# Patient Record
Sex: Male | Born: 1980
Health system: Southern US, Community
[De-identification: ages and names within clinical notes are randomized; demographics above are authoritative.]

## PROBLEM LIST (undated history)

## (undated) DIAGNOSIS — K4090 Unilateral inguinal hernia, without obstruction or gangrene, not specified as recurrent: Secondary | ICD-10-CM

## (undated) DIAGNOSIS — F1410 Cocaine abuse, uncomplicated: Secondary | ICD-10-CM

## (undated) DIAGNOSIS — R002 Palpitations: Secondary | ICD-10-CM

## (undated) DIAGNOSIS — K279 Peptic ulcer, site unspecified, unspecified as acute or chronic, without hemorrhage or perforation: Secondary | ICD-10-CM

## (undated) DIAGNOSIS — R079 Chest pain, unspecified: Secondary | ICD-10-CM

## (undated) HISTORY — PX: CYSTOSTOMY W/ BLADDER BIOPSY: SHX1431

## (undated) HISTORY — DX: Palpitations: R00.2

## (undated) HISTORY — DX: Unilateral inguinal hernia, without obstruction or gangrene, not specified as recurrent: K40.90

## (undated) HISTORY — DX: Cocaine abuse, uncomplicated: F14.10

## (undated) HISTORY — DX: Chest pain, unspecified: R07.9

## (undated) HISTORY — DX: Peptic ulcer, site unspecified, unspecified as acute or chronic, without hemorrhage or perforation: K27.9

---
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
## 2005-11-02 ENCOUNTER — Encounter (INDEPENDENT_AMBULATORY_CARE_PROVIDER_SITE_OTHER): Payer: Self-pay | Admitting: *Deleted

## 2005-11-02 ENCOUNTER — Ambulatory Visit (HOSPITAL_BASED_OUTPATIENT_CLINIC_OR_DEPARTMENT_OTHER): Admission: RE | Admit: 2005-11-02 | Discharge: 2005-11-02 | Payer: Self-pay | Admitting: Urology

## 2007-07-26 ENCOUNTER — Emergency Department (HOSPITAL_COMMUNITY): Admission: EM | Admit: 2007-07-26 | Discharge: 2007-07-26 | Payer: Self-pay | Admitting: Emergency Medicine

## 2009-07-28 ENCOUNTER — Emergency Department (HOSPITAL_COMMUNITY): Admission: EM | Admit: 2009-07-28 | Discharge: 2009-07-28 | Payer: Self-pay | Admitting: Emergency Medicine

## 2009-07-30 ENCOUNTER — Emergency Department (HOSPITAL_COMMUNITY): Admission: EM | Admit: 2009-07-30 | Discharge: 2009-07-30 | Payer: Self-pay | Admitting: Emergency Medicine

## 2009-08-07 ENCOUNTER — Ambulatory Visit: Payer: Self-pay | Admitting: Cardiology

## 2009-08-07 ENCOUNTER — Inpatient Hospital Stay (HOSPITAL_COMMUNITY): Admission: EM | Admit: 2009-08-07 | Discharge: 2009-08-08 | Payer: Self-pay | Admitting: Emergency Medicine

## 2009-08-07 ENCOUNTER — Ambulatory Visit: Payer: Self-pay | Admitting: Internal Medicine

## 2009-08-07 ENCOUNTER — Encounter (INDEPENDENT_AMBULATORY_CARE_PROVIDER_SITE_OTHER): Payer: Self-pay | Admitting: Family Medicine

## 2009-08-08 ENCOUNTER — Encounter (INDEPENDENT_AMBULATORY_CARE_PROVIDER_SITE_OTHER): Payer: Self-pay | Admitting: *Deleted

## 2010-03-11 NOTE — Discharge Summary (Signed)
Summary: Hosp Discharge Summary    NAME:  Colin Davidson, Colin Davidson NO.:  [id number]      MEDICAL RECORD NO.:  [id number]          PATIENT TYPE:  INP      LOCATION:  1436                         FACILITY:  [facility]      PHYSICIAN:  Brendia Sacks, MD    DATE OF BIRTH:  11/25/1980      DATE OF ADMISSION:  08/07/2009   DATE OF DISCHARGE:  08/08/2009                                  DISCHARGE SUMMARY      PRIMARY CARE PHYSICIAN:  Will be [facility].      PRIMARY GASTROENTEROLOGIST:  Dr. Juanda Chance.      CONDITION ON DISCHARGE:  Improved.      DISCHARGE DIAGNOSES:   1. Upper gastrointestinal bleed secondary to NSAID use.   2. NSAID use.   3. Cocaine abuse.      HPI:  This is a 30 year old man who presented to the emergency room with   abdominal pain and dark stools.      HOSPITAL COURSE:   1. Upper GI bleed.  Patient was admitted to the medical floor.  Serial       hemoglobins showed no significant change in his hemoglobin.  His       epigastric pain resolved and he has had no further significant       bleeding.  He was seen in consultation by Dr. Juanda Chance of       Gastroenterology.  No EGD was recommended at this point.  Dr.       Juanda Chance has recommend Prilosec 20 mg daily for the next 6 weeks and       plans to see him in the outpatient setting.  Patient feels well and       wants to go to work today.   2. Cocaine abuse, have recommend cessation.   3. NSAID abuse.  He has been advised against using any NSAIDs.      CONSULTATIONS:  Dr. Juanda Chance, Gastroenterology.      PROCEDURES:  None.      IMAGING:  Acute abdominal series, August 07, 2009:  Large stool burden, no   acute findings.      ANCILLARY STUDIES:  A 2D echocardiogram:  Left ventricular ejection   fraction 55%, wall motion normal, no regional wall motion abnormalities.   No significant abnormalities noted.      PERTINENT LABORATORY STUDIES:   1. Urine drug screen positive for cocaine.   2. Serum lipase within  normal limits.   3. Serum alcohol level negative.   4. TSH within normal limits, 1.831.   5. CBC:  Hemoglobin 15.9 on admission, on discharge 12.5 and stable.       White blood cell count and platelet counts within normal limits.   6. Cardiac enzymes negative.      EXAM ON DISCHARGE:  Patient is feeling well.  No complaints.  No pain.   Temperature is 97.5.  Pulse 60.  Respirations 16.  Saturation 100% on   room air.  He appears well and stable for discharge.  DISCHARGE INSTRUCTIONS:  Patient should follow up with [facility], Dr.   Audria Nine, August 27, 2009, at 10:15 a.m. and with Willette Cluster, nurse   practitioner, at [facility] August 23, 2009, at 9:30 a.m.      ACTIVITY:  No restrictions.      DIET:  No restrictions.      SPECIAL INSTRUCTIONS:  No NSAIDs and no drug use.      DISCHARGE MEDICATIONS:   1. Prilosec over the counter 20 mg p.o. daily, take for 6 weeks.   2. Continue Adderall as directed.      Discontinue the following medication:   1. Meloxicam.   2. Any other NSAIDs.      OUTSTANDING STUDIES AT TIME OF DISCHARGE:  None.      THINGS TO FOLLOW UP IN THE OUTPATIENT SETTING:  Upper GI bleed, see   above.      TIME COORDINATING DISCHARGE:  20 minutes.               Brendia Sacks, MD            DG/MEDQ  D:  08/08/2009  T:  08/08/2009  Job:  [id number]      Electronically Signed by Brendia Sacks  on 08/22/2009 12:48:03 PM

## 2010-03-11 NOTE — Letter (Signed)
Summary: New Patient letter  [facility]  [address] [place], [place] [address]   Phone: [phone]  Fax: [phone]       08/08/2009 MRN: [id number]  Colin Davidson [address] [place], [place]  [address]  Dear Mr. ANCTIL,  Welcome to the Gastroenterology Division at [facility].    You are scheduled to see Willette Cluster on 08-23-09 at 9:30am on the 3rd floor at [facility], [address].  We ask that you try to arrive at our office 15 minutes prior to your appointment time to allow for check-in.  We would like you to complete the enclosed self-administered evaluation form prior to your visit and bring it with you on the day of your appointment.  We will review it with you.  Also, please bring a complete list of all your medications or, if you prefer, bring the medication bottles and we will list them.  Please bring your insurance card so that we may make a copy of it.  If your insurance requires a referral to see a specialist, please bring your referral form from your primary care physician.  Co-payments are due at the time of your visit and may be paid by cash, check or credit card.     Your office visit will consist of a consult with your physician (includes a physical exam), any laboratory testing he/she may order, scheduling of any necessary diagnostic testing (e.g. x-ray, ultrasound, CT-scan), and scheduling of a procedure (e.g. Endoscopy, Colonoscopy) if required.  Please allow enough time on your schedule to allow for any/all of these possibilities.    If you cannot keep your appointment, please call [phone] to cancel or reschedule prior to your appointment date.  This allows [place] the opportunity to schedule an appointment for another patient in need of care.  If you do not cancel or reschedule by 5 p.m. the business day prior to your appointment date, you will be charged a $50.00 late cancellation/no-show fee.    Thank you for choosing  [facility] for your medical needs.  We appreciate the opportunity to care for you.  Please visit [place] at our website  to learn more about our practice.                     Sincerely,                                                             The Gastroenterology Division

## 2010-04-16 ENCOUNTER — Emergency Department (HOSPITAL_COMMUNITY): Payer: Self-pay

## 2010-04-16 ENCOUNTER — Emergency Department (HOSPITAL_COMMUNITY)
Admission: EM | Admit: 2010-04-16 | Discharge: 2010-04-17 | Disposition: A | Discharge status: Home / Self Care | Payer: Self-pay | Attending: Emergency Medicine | Admitting: Emergency Medicine

## 2010-04-16 DIAGNOSIS — Z8711 Personal history of peptic ulcer disease: Secondary | ICD-10-CM | POA: Insufficient documentation

## 2010-04-16 DIAGNOSIS — F17200 Nicotine dependence, unspecified, uncomplicated: Secondary | ICD-10-CM | POA: Insufficient documentation

## 2010-04-16 DIAGNOSIS — R079 Chest pain, unspecified: Secondary | ICD-10-CM | POA: Insufficient documentation

## 2010-04-16 LAB — RAPID URINE DRUG SCREEN, HOSP PERFORMED
Amphetamines: NOT DETECTED
Barbiturates: NOT DETECTED
Benzodiazepines: NOT DETECTED
Cocaine: NOT DETECTED
Opiates: NOT DETECTED
Tetrahydrocannabinol: NOT DETECTED

## 2010-04-27 LAB — TSH: TSH: 1.831 u[IU]/mL (ref 0.350–4.500)

## 2010-04-27 LAB — COMPREHENSIVE METABOLIC PANEL
ALT: 13 U/L (ref 0–53)
AST: 21 U/L (ref 0–37)
Albumin: 4.4 g/dL (ref 3.5–5.2)
Alkaline Phosphatase: 78 U/L (ref 39–117)
Chloride: 102 mEq/L (ref 96–112)
GFR calc Af Amer: >60 mL/min (ref >60)
Potassium: 4.7 mEq/L (ref 3.5–5.1)
Sodium: 139 mEq/L (ref 135–145)
Total Bilirubin: 0.9 mg/dL (ref 0.3–1.2)
Total Protein: 7.5 g/dL (ref 6.0–8.3)

## 2010-04-27 LAB — CBC
HCT: 37.1 % — ABNORMAL LOW (ref 39.0–52.0)
HCT: 39.2 % (ref 39.0–52.0)
HCT: 41.1 % (ref 39.0–52.0)
Hemoglobin: 12.7 g/dL — ABNORMAL LOW (ref 13.0–17.0)
Hemoglobin: 13.4 g/dL (ref 13.0–17.0)
Hemoglobin: 14 g/dL (ref 13.0–17.0)
Hemoglobin: 14 g/dL (ref 13.0–17.0)
Hemoglobin: 14.1 g/dL (ref 13.0–17.0)
MCH: 31.8 pg (ref 26.0–34.0)
MCH: 32.1 pg (ref 26.0–34.0)
MCH: 32.3 pg (ref 26.0–34.0)
MCHC: 33.4 g/dL (ref 30.0–36.0)
MCHC: 34.3 g/dL (ref 30.0–36.0)
MCHC: 34.3 g/dL (ref 30.0–36.0)
MCV: 93.5 fL (ref 78.0–100.0)
MCV: 93.9 fL (ref 78.0–100.0)
MCV: 94.1 fL (ref 78.0–100.0)
MCV: 94.2 fL (ref 78.0–100.0)
MCV: 94.4 fL (ref 78.0–100.0)
Platelets: 243 10*3/uL (ref 150–400)
Platelets: 266 10*3/uL (ref 150–400)
Platelets: 305 10*3/uL (ref 150–400)
RBC: 4.17 MIL/uL — ABNORMAL LOW (ref 4.22–5.81)
RBC: 4.34 MIL/uL (ref 4.22–5.81)
RBC: 4.39 MIL/uL (ref 4.22–5.81)
RBC: 4.45 MIL/uL (ref 4.22–5.81)
RBC: 4.89 MIL/uL (ref 4.22–5.81)
RDW: 13 % (ref 11.5–15.5)
RDW: 13.2 % (ref 11.5–15.5)
RDW: 13.3 % (ref 11.5–15.5)
WBC: 7.1 10*3/uL (ref 4.0–10.5)
WBC: 7.4 10*3/uL (ref 4.0–10.5)
WBC: 8.6 10*3/uL (ref 4.0–10.5)
WBC: 9.5 10*3/uL (ref 4.0–10.5)

## 2010-04-27 LAB — DIFFERENTIAL
Basophils Absolute: 0 10*3/uL (ref 0.0–0.1)
Basophils Relative: 1 % (ref 0–1)
Basophils Relative: 1 % (ref 0–1)
Eosinophils Relative: 2 % (ref 0–5)
Lymphs Abs: 2 10*3/uL (ref 0.7–4.0)
Monocytes Absolute: 0.5 10*3/uL (ref 0.1–1.0)
Monocytes Absolute: 0.5 10*3/uL (ref 0.1–1.0)
Monocytes Relative: 6 % (ref 3–12)
Monocytes Relative: 6 % (ref 3–12)
Neutro Abs: 5 10*3/uL (ref 1.7–7.7)
Neutro Abs: 6 10*3/uL (ref 1.7–7.7)

## 2010-04-27 LAB — CARDIAC PANEL(CRET KIN+CKTOT+MB+TROPI)
CK, MB: 0.6 ng/mL (ref 0.3–4.0)
CK, MB: 0.7 ng/mL (ref 0.3–4.0)
CK, MB: 0.7 ng/mL (ref 0.3–4.0)
Relative Index: INVALID (ref 0.0–2.5)
Total CK: 54 U/L (ref 7–232)
Total CK: 55 U/L (ref 7–232)
Total CK: 56 U/L (ref 7–232)
Troponin I: 0.02 ng/mL (ref 0.00–0.06)

## 2010-04-27 LAB — GLUCOSE, CAPILLARY
Glucose-Capillary: 162 mg/dL — ABNORMAL HIGH (ref 70–99)
Glucose-Capillary: 83 mg/dL (ref 70–99)
Glucose-Capillary: 95 mg/dL (ref 70–99)

## 2010-04-27 LAB — HEMOCCULT GUIAC POC 1CARD (OFFICE): Fecal Occult Bld: POSITIVE

## 2010-04-27 LAB — ABO/RH: ABO/RH(D): A POS

## 2010-04-27 LAB — POCT I-STAT, CHEM 8
Calcium, Ion: 1.2 mmol/L (ref 1.12–1.32)
Chloride: 106 mEq/L (ref 96–112)
Creatinine, Ser: 0.9 mg/dL (ref 0.4–1.5)
Glucose, Bld: 99 mg/dL (ref 70–99)
HCT: 44 % (ref 39.0–52.0)

## 2010-04-27 LAB — ETHANOL: Alcohol, Ethyl (B): <5 mg/dL (ref 0–10)

## 2010-04-27 LAB — POCT CARDIAC MARKERS
CKMB, poc: 1 ng/mL (ref 1.0–8.0)
Troponin i, poc: <0.05 ng/mL (ref 0.00–0.09)

## 2010-04-27 LAB — RAPID URINE DRUG SCREEN, HOSP PERFORMED
Amphetamines: NOT DETECTED
Barbiturates: NOT DETECTED

## 2010-04-27 LAB — CROSSMATCH: ABO/RH(D): A POS

## 2010-06-27 NOTE — Op Note (Signed)
NAME:  Colin Davidson, Colin Davidson NO.:  [id number]   MEDICAL RECORD NO.:  [id number]          PATIENT TYPE:  AMB   LOCATION:  [facility]                         FACILITY:  [facility]   PHYSICIAN:  Sigmund I. Patsi Sears, M.D.DATE OF BIRTH:  20-Oct-1980   DATE OF PROCEDURE:  11/02/2005  DATE OF DISCHARGE:                                 OPERATIVE REPORT   PREOPERATIVE DIAGNOSES:  1. Chronic left testicular pain with left varicocele.  2. Gross hematuria.   OPERATIONS:  1. Left internal splenic vein ligation.  2. Flexible cystoscopy.   SURGEON:  Sigmund I. Patsi Sears, M.D.   ANESTHESIA:  General LMA.   PREPARATION:  After appropriate preanesthesia, the patient was brought to  the operating room, placed on the operating room in the dorsal supine  position, where general LMA anesthesia was induced.  He was then replaced in  the dorsal lithotomy position, where the pubis was prepped with Betadine  solution and draped in the usual fashion.   Review of history shows that this 30 year old single male has significant  chronic left testicular pain, particularly after exercising and sexual  activity.  The ultrasound examination showed a normal testicle but a left  varicocele was present.  The patient also has a history of hematuria and has  never had cystoscopy.  He is now for left internal splenic vein ligation and  cystoscopy.   PROCEDURE:  The left inguinal canal was prepped and draped in the usual  fashion.  It is noted that the patient previously shaved his left groin for  unknown cause, though.   A 6 cm left inguinal incision was made, subcutaneous tissue dissected with  the electrosurgical unit.  The Scarpa fascia layer was identified and  incised.  Multiple enlarged veins were identified, and these were ligated  with 3-0 Vicryl suture.   The fascia of the external oblique was identified and entered and  subcutaneous tissue was dissected in order to avoid injury to the  ilioinguinal nerve.  The pubic tubercle identified, and a right-angle clamp  was placed underneath the spermatic cord.   The spermatic cord was then dissected and a single large vein and several  smaller veins were identified and separately ligated with 3-0 Vicryl suture.  The vas and the artery to the testicle and artery of the vas were palpable  and remained in position at the posterior portion of the spermatic cord.  The spermatic cord was anesthetized with 0.25% Marcaine proximally and  distally.  No bleeding was noted.  There was a diastasis of the fascia, and  this was closed with running 3-0 well Vicryl suture.  The Scarpa fascia was  closed with running 3-0 Vicryl suture as well.  The skin was closed with  running 4-0 Vicryl subcuticular layer as well as Steri-Strips and collodion.  The patient was awakened and taken to the recovery room after given IV  Toradol, in good condition.  Cystoscopy was accomplished with a flexible  cystoscope and showed the patient had a normal urethra, normal prostate,  normal bladder neck, normal bladder base.  There was no evidence  of bladder  stone, tumor, or bladder diverticular formation.   The patient was then awakened and taken to the recovery room in good  condition.      Sigmund I. Patsi Sears, M.D.  Electronically Signed     SIT/MEDQ  D:  11/02/2005  T:  11/04/2005  Job:  [id number]

## 2010-11-06 LAB — COMPREHENSIVE METABOLIC PANEL
ALT: 13
AST: 18
Albumin: 4.4
Calcium: 9.9
GFR calc Af Amer: >60
Sodium: 135
Total Protein: 7.2

## 2010-11-06 LAB — CBC
MCHC: 34.6
RDW: 12.8

## 2010-11-06 LAB — URINALYSIS, ROUTINE W REFLEX MICROSCOPIC
Bilirubin Urine: NEGATIVE
Hgb urine dipstick: NEGATIVE
Specific Gravity, Urine: 1.007
Urobilinogen, UA: 0.2
pH: 7.5

## 2010-11-06 LAB — DIFFERENTIAL
Eosinophils Absolute: 0
Eosinophils Relative: 0
Lymphs Abs: 2
Monocytes Relative: 6

## 2010-11-06 LAB — RAPID URINE DRUG SCREEN, HOSP PERFORMED
Amphetamines: NOT DETECTED
Cocaine: NOT DETECTED
Opiates: NOT DETECTED
Tetrahydrocannabinol: NOT DETECTED

## 2013-12-17 ENCOUNTER — Encounter: Payer: Self-pay | Admitting: *Deleted

## 2016-03-27 ENCOUNTER — Ambulatory Visit
Admission: RE | Admit: 2016-03-27 | Discharge: 2016-03-27 | Disposition: A | Discharge status: Home / Self Care | Payer: Managed Care, Other (non HMO) | Source: Ambulatory Visit | Attending: Internal Medicine | Admitting: Internal Medicine

## 2016-03-27 ENCOUNTER — Other Ambulatory Visit: Payer: Self-pay | Admitting: Internal Medicine

## 2016-03-27 DIAGNOSIS — R070 Pain in throat: Secondary | ICD-10-CM

## 2016-11-24 DIAGNOSIS — G5603 Carpal tunnel syndrome, bilateral upper limbs: Secondary | ICD-10-CM | POA: Diagnosis not present

## 2017-03-26 DIAGNOSIS — F17210 Nicotine dependence, cigarettes, uncomplicated: Secondary | ICD-10-CM | POA: Diagnosis not present

## 2017-03-26 DIAGNOSIS — G5603 Carpal tunnel syndrome, bilateral upper limbs: Secondary | ICD-10-CM | POA: Diagnosis not present

## 2017-04-14 DIAGNOSIS — N50819 Testicular pain, unspecified: Secondary | ICD-10-CM | POA: Diagnosis not present

## 2017-04-19 DIAGNOSIS — N50819 Testicular pain, unspecified: Secondary | ICD-10-CM | POA: Diagnosis not present

## 2017-05-22 ENCOUNTER — Ambulatory Visit (HOSPITAL_COMMUNITY)
Admission: EM | Admit: 2017-05-22 | Discharge: 2017-05-22 | Disposition: A | Discharge status: Home / Self Care | Payer: 59 | Attending: Family Medicine | Admitting: Family Medicine

## 2017-05-22 ENCOUNTER — Other Ambulatory Visit: Payer: Self-pay

## 2017-05-22 DIAGNOSIS — Z202 Contact with and (suspected) exposure to infections with a predominantly sexual mode of transmission: Secondary | ICD-10-CM

## 2017-05-22 DIAGNOSIS — Z113 Encounter for screening for infections with a predominantly sexual mode of transmission: Secondary | ICD-10-CM

## 2017-05-22 DIAGNOSIS — R369 Urethral discharge, unspecified: Secondary | ICD-10-CM

## 2017-05-22 DIAGNOSIS — Z23 Encounter for immunization: Secondary | ICD-10-CM

## 2017-05-22 DIAGNOSIS — T1592XA Foreign body on external eye, part unspecified, left eye, initial encounter: Secondary | ICD-10-CM | POA: Diagnosis not present

## 2017-05-22 LAB — POCT URINALYSIS DIP (DEVICE)
BILIRUBIN URINE: NEGATIVE
Glucose, UA: NEGATIVE mg/dL
KETONES UR: NEGATIVE mg/dL
LEUKOCYTES UA: NEGATIVE
NITRITE: NEGATIVE
Protein, ur: NEGATIVE mg/dL
Specific Gravity, Urine: 1.015 (ref 1.005–1.030)
Urobilinogen, UA: 0.2 mg/dL (ref 0.0–1.0)
pH: 7 (ref 5.0–8.0)

## 2017-05-22 MED ORDER — AZITHROMYCIN 250 MG PO TABS
1000.0000 mg | ORAL_TABLET | Freq: Once | ORAL | Status: AC
  Administered 2017-05-22: 1000 mg via ORAL

## 2017-05-22 MED ORDER — CEFTRIAXONE SODIUM 250 MG IJ SOLR
250.0000 mg | Freq: Once | INTRAMUSCULAR | Status: AC
  Administered 2017-05-22: 250 mg via INTRAMUSCULAR

## 2017-05-22 MED ORDER — TETANUS-DIPHTH-ACELL PERTUSSIS 5-2.5-18.5 LF-MCG/0.5 IM SUSP
0.5000 mL | Freq: Once | INTRAMUSCULAR | Status: AC
  Administered 2017-05-22: 0.5 mL via INTRAMUSCULAR

## 2017-05-22 MED ORDER — TOBRAMYCIN 0.3 % OP SOLN: 1.0000 [drp] | Freq: Four times a day (QID) | OPHTHALMIC | 0 refills | Status: DC

## 2017-05-22 MED ORDER — HYDROCODONE-ACETAMINOPHEN 5-325 MG PO TABS: 1.0000 | ORAL_TABLET | Freq: Four times a day (QID) | ORAL | 0 refills | Status: DC | PRN

## 2017-05-22 MED ORDER — CEFTRIAXONE SODIUM 250 MG IJ SOLR
INTRAMUSCULAR | Status: AC
  Filled 2017-05-22: qty 250

## 2017-05-22 MED ORDER — TETRACAINE HCL 0.5 % OP SOLN
OPHTHALMIC | Status: AC
  Filled 2017-05-22: qty 4

## 2017-05-22 MED ORDER — AZITHROMYCIN 250 MG PO TABS
ORAL_TABLET | ORAL | Status: AC
  Filled 2017-05-22: qty 4

## 2017-05-22 MED ORDER — TETANUS-DIPHTH-ACELL PERTUSSIS 5-2.5-18.5 LF-MCG/0.5 IM SUSP
INTRAMUSCULAR | Status: AC
  Filled 2017-05-22: qty 0.5

## 2017-05-22 MED ORDER — LIDOCAINE HCL (PF) 1 % IJ SOLN
INTRAMUSCULAR | Status: AC
  Filled 2017-05-22: qty 2

## 2017-05-22 NOTE — ED Triage Notes (Signed)
Per pt he scratched his eye and now it burns when he trys to close it. Per pt its his left eye. Per pt she is also worried he may have either UTI or STD.

## 2017-05-22 NOTE — Discharge Instructions (Signed)

## 2017-05-22 NOTE — ED Provider Notes (Signed)
[facility]   [id number] 05/22/17 Arrival Time: 1743  ASSESSMENT & PLAN:  1. Foreign body, eye, left, initial encounter   2. Penile discharge     Meds ordered this encounter  Medications  . HYDROcodone-acetaminophen (NORCO/VICODIN) 5-325 MG tablet    Sig: Take 1 tablet by mouth every 6 (six) hours as needed for moderate pain or severe pain.    Dispense:  8 tablet    Refill:  0  . tobramycin (TOBREX) 0.3 % ophthalmic solution    Sig: Place 1 drop into the right eye every 6 (six) hours.    Dispense:  5 mL    Refill:  0  . azithromycin (ZITHROMAX) tablet 1,000 mg  . cefTRIAXone (ROCEPHIN) injection 250 mg  . Tdap (BOOSTRIX) injection 0.5 mL   Spoke with ophthalmologist on call. He is to call her office first thing Monday morning to be worked in. May return here if any new or worsening symptoms. Td given. Medication sedation precautions.  Empiric gonorrhea/chlamydia treatment given. Urine cytology sent.  Reviewed expectations re: course of current medical issues. Questions answered. Outlined signs and symptoms indicating need for more acute intervention. Patient verbalized understanding. After Visit Summary given.   SUBJECTIVE:  Colin Davidson is a 37 y.o. male who presents with complaint of possible foreign body in his left eye. Noticed discomfort after grinding metal today. No visual changes or photophobia. Able to keep eye open "but it feels gritty." No OTC treatment. Does not wear contact lenses. Unsure of last Td.  Also requests STD screening and treatment. Worried that he has been exposed. New male sexual partner. He noticed penile discharge yesterday. Continuing today. No urinary frequency or dysuria.   ROS: As per HPI.  OBJECTIVE:  General appearance: alert; no distress Eyes: PERRLA bilaterally, all EOMI, OS with watering; fluorescein shows punctate uptake over cornea; small foreign body visible Neck: supple Lungs: clear to auscultation  bilaterally Heart: regular rate and rhythm GU: declines Skin: warm and dry Psychological: alert and cooperative; normal mood and affect  Allergies  Allergen Reactions  . Chantix [Varenicline]     Past Medical History:  Diagnosis Date  . Chest pain syndrome   . Drug abuse, cocaine type   . Inguinal hernia   . Palpitations   . PUD (peptic ulcer disease)    Social History   Socioeconomic History  . Marital status: Single    Spouse name: Not on file  . Number of children: Not on file  . Years of education: Not on file  . Highest education level: Not on file  Occupational History  . Not on file  Social Needs  . Financial resource strain: Not on file  . Food insecurity:    Worry: Not on file    Inability: Not on file  . Transportation needs:    Medical: Not on file    Non-medical: Not on file  Tobacco Use  . Smoking status: Current Every Day Smoker  Substance and Sexual Activity  . Alcohol use: No  . Drug use: No  . Sexual activity: Not on file  Lifestyle  . Physical activity:    Days per week: Not on file    Minutes per session: Not on file  . Stress: Not on file  Relationships  . Social connections:    Talks on phone: Not on file    Gets together: Not on file    Attends religious service: Not on file    Active member of club  or organization: Not on file    Attends meetings of clubs or organizations: Not on file    Relationship status: Not on file  . Intimate partner violence:    Fear of current or ex partner: Not on file    Emotionally abused: Not on file    Physically abused: Not on file    Forced sexual activity: Not on file  Other Topics Concern  . Not on file  Social History Narrative  . Not on file   Family History  Family history unknown: Yes   No past surgical history on file.   Mardella Layman, MD 06/01/17 [phone]

## 2017-05-24 ENCOUNTER — Telehealth (HOSPITAL_COMMUNITY): Payer: Self-pay

## 2017-05-24 DIAGNOSIS — T1502XA Foreign body in cornea, left eye, initial encounter: Secondary | ICD-10-CM | POA: Diagnosis not present

## 2017-05-24 LAB — URINE CYTOLOGY ANCILLARY ONLY
CHLAMYDIA, DNA PROBE: NEGATIVE
Neisseria Gonorrhea: NEGATIVE
TRICH (WINDOWPATH): NEGATIVE

## 2017-05-24 NOTE — Telephone Encounter (Signed)
Results are within normal range. Pt contacted and made aware. Verbalized understanding.   

## 2017-07-28 DIAGNOSIS — M25562 Pain in left knee: Secondary | ICD-10-CM | POA: Diagnosis not present

## 2017-11-18 DIAGNOSIS — M7712 Lateral epicondylitis, left elbow: Secondary | ICD-10-CM | POA: Diagnosis not present

## 2017-11-18 DIAGNOSIS — H04301 Unspecified dacryocystitis of right lacrimal passage: Secondary | ICD-10-CM | POA: Diagnosis not present

## 2018-01-09 ENCOUNTER — Other Ambulatory Visit: Payer: Self-pay

## 2018-01-09 ENCOUNTER — Encounter (HOSPITAL_COMMUNITY): Payer: Self-pay | Admitting: Emergency Medicine

## 2018-01-09 ENCOUNTER — Ambulatory Visit (HOSPITAL_COMMUNITY)
Admission: EM | Admit: 2018-01-09 | Discharge: 2018-01-09 | Disposition: A | Discharge status: Home / Self Care | Payer: 59 | Attending: Family Medicine | Admitting: Family Medicine

## 2018-01-09 DIAGNOSIS — J40 Bronchitis, not specified as acute or chronic: Secondary | ICD-10-CM | POA: Diagnosis not present

## 2018-01-09 MED ORDER — METHYLPREDNISOLONE SODIUM SUCC 125 MG IJ SOLR
80.0000 mg | Freq: Once | INTRAMUSCULAR | Status: AC
  Administered 2018-01-09: 80 mg via INTRAMUSCULAR

## 2018-01-09 MED ORDER — ALBUTEROL SULFATE HFA 108 (90 BASE) MCG/ACT IN AERS: 2.0000 | INHALATION_SPRAY | RESPIRATORY_TRACT | 1 refills | Status: DC | PRN

## 2018-01-09 MED ORDER — METHYLPREDNISOLONE SODIUM SUCC 125 MG IJ SOLR
INTRAMUSCULAR | Status: AC
  Filled 2018-01-09: qty 2

## 2018-01-09 NOTE — ED Triage Notes (Signed)
[facility] a [facility] a [facility] x 1 day.

## 2018-01-09 NOTE — ED Provider Notes (Signed)
[facility]    CSN: [id number] Arrival date & time: 01/09/18  1409     History   Chief Complaint Chief Complaint  Patient presents with  . Cough    HPI Colin Davidson is a 37 y.o. male.   This is a 37 year old man who presents with 1 day of cough.  He is an established patient here.  His primary doctor is Dr. Johnella MoloneyNeville Gates.  Patient is a smoker.  He says that it hurts quite a bit cough.  He is bringing up green phlegm.  He did have some chills last night.     Past Medical History:  Diagnosis Date  . Chest pain syndrome   . Drug abuse, cocaine type (HCC)   . Inguinal hernia   . Palpitations   . PUD (peptic ulcer disease)     There are no active problems to display for this patient.   History reviewed. No pertinent surgical history.     Home Medications    Prior to Admission medications   Medication Sig Start Date End Date Taking? Authorizing Provider  albuterol (PROVENTIL HFA;VENTOLIN HFA) 108 (90 Base) MCG/ACT inhaler Inhale 2 puffs into the lungs every 4 (four) hours as needed for wheezing or shortness of breath (cough, shortness of breath or wheezing.). 01/09/18   Elvina SidleLauenstein, Jatoria Kneeland, MD    Family History Family History  Family history unknown: Yes    Social History Social History   Tobacco Use  . Smoking status: Current Every Day Smoker  Substance Use Topics  . Alcohol use: No  . Drug use: No     Allergies   Chantix [varenicline]   Review of Systems Review of Systems  Constitutional: Positive for chills.  HENT: Positive for sore throat.   Respiratory: Positive for cough.      Physical Exam Triage Vital Signs ED Triage Vitals  Enc Vitals Group     BP 01/09/18 1539 (!) 123/55     Pulse Rate 01/09/18 1539 (!) 104     Resp 01/09/18 1539 16     Temp 01/09/18 1539 98.5 F (36.9 C)     Temp Source 01/09/18 1539 Oral     SpO2 01/09/18 1539 96 %     Weight --      Height --      Head Circumference --      Peak Flow --    Pain Score 01/09/18 1538 8     Pain Loc --      Pain Edu? --      Excl. in GC? --    No data found.  Updated Vital Signs BP (!) 123/55 (BP Location: Right Arm)   Pulse (!) 104   Temp 98.5 F (36.9 C) (Oral)   Resp 16   SpO2 96%    Physical Exam  Constitutional: He is oriented to person, place, and time. He appears well-developed and well-nourished.  HENT:  Right Ear: External ear normal.  Left Ear: External ear normal.  Mouth/Throat: Oropharynx is clear and moist.  Missing a few teeth Erythematous posterior pharynx  Eyes: Conjunctivae are normal.  Neck: Normal range of motion. Neck supple.  Cardiovascular: Normal rate, regular rhythm and normal heart sounds.  Pulmonary/Chest: Effort normal. He has wheezes.  Coarse rhonchi in both lung fields  Musculoskeletal: Normal range of motion.  Neurological: He is alert and oriented to person, place, and time.  Skin: Skin is warm and dry.  Nursing note and vitals reviewed.  UC Treatments / Results  Labs (all labs ordered are listed, but only abnormal results are displayed) Labs Reviewed - No data to display  EKG None  Radiology No results found.  Procedures Procedures (including critical care time)  Medications Ordered in UC Medications  methylPREDNISolone sodium succinate (SOLU-MEDROL) 125 mg/2 mL injection 80 mg (has no administration in time range)    Initial Impression / Assessment and Plan / UC Course  I have reviewed the triage vital signs and the nursing notes.  Pertinent labs & imaging results that were available during my care of the patient were reviewed by me and considered in my medical decision making (see chart for details).    Final Clinical Impressions(s) / UC Diagnoses   Final diagnoses:  Bronchitis   Discharge Instructions   None    ED Prescriptions    Medication Sig Dispense Auth. Provider   albuterol (PROVENTIL HFA;VENTOLIN HFA) 108 (90 Base) MCG/ACT inhaler Inhale 2 puffs into the  lungs every 4 (four) hours as needed for wheezing or shortness of breath (cough, shortness of breath or wheezing.). 1 Inhaler Elvina Sidle, MD     Controlled Substance Prescriptions [place] Controlled Substance Registry consulted? Not Applicable   Elvina Sidle, MD 01/09/18 [phone]

## 2018-01-17 ENCOUNTER — Ambulatory Visit
Admission: RE | Admit: 2018-01-17 | Discharge: 2018-01-17 | Disposition: A | Discharge status: Home / Self Care | Payer: 59 | Source: Ambulatory Visit | Attending: Geriatric Medicine | Admitting: Geriatric Medicine

## 2018-01-17 ENCOUNTER — Other Ambulatory Visit: Payer: Self-pay | Admitting: Geriatric Medicine

## 2018-01-17 DIAGNOSIS — R059 Cough, unspecified: Secondary | ICD-10-CM

## 2018-01-17 DIAGNOSIS — R05 Cough: Secondary | ICD-10-CM

## 2018-01-17 DIAGNOSIS — R062 Wheezing: Secondary | ICD-10-CM | POA: Diagnosis not present

## 2018-06-02 MED FILL — AMPHETAMINE-DEXTROAMPHETAMI: 30 | 30 days supply | Qty: 60 | Fill #0

## 2018-06-30 MED FILL — AMPHETAMINE-DEXTROAMPHETAMI: 30 | 30 days supply | Qty: 60 | Fill #0

## 2018-08-01 MED FILL — AMPHETAMINE-DEXTROAMPHETAMI: 30 | 30 days supply | Qty: 60 | Fill #0

## 2018-08-31 MED FILL — AMPHETAMINE-DEXTROAMPHETAMI: 30 | 30 days supply | Qty: 60 | Fill #0

## 2018-09-06 ENCOUNTER — Other Ambulatory Visit: Payer: Self-pay

## 2018-09-06 ENCOUNTER — Ambulatory Visit (INDEPENDENT_AMBULATORY_CARE_PROVIDER_SITE_OTHER): Payer: 59

## 2018-09-06 ENCOUNTER — Ambulatory Visit (HOSPITAL_COMMUNITY)
Admission: EM | Admit: 2018-09-06 | Discharge: 2018-09-06 | Disposition: A | Discharge status: Home / Self Care | Payer: 59 | Attending: Family Medicine | Admitting: Family Medicine

## 2018-09-06 ENCOUNTER — Encounter (HOSPITAL_COMMUNITY): Payer: Self-pay | Admitting: Emergency Medicine

## 2018-09-06 DIAGNOSIS — S20212A Contusion of left front wall of thorax, initial encounter: Secondary | ICD-10-CM

## 2018-09-06 MED ORDER — HYDROCODONE-ACETAMINOPHEN 5-325 MG PO TABS: 1.0000 | ORAL_TABLET | Freq: Four times a day (QID) | ORAL | 0 refills | Status: DC | PRN

## 2018-09-06 MED FILL — HYDROCODON-APAP 5-325: 5-325 | 3 days supply | Qty: 10 | Fill #0

## 2018-09-06 NOTE — ED Provider Notes (Signed)
[facility]   [id number] 09/06/18 Arrival Time: 1027  ASSESSMENT & PLAN:  1. Rib contusion, left, initial encounter     I have personally viewed the imaging studies ordered this visit. No fractures identified. No pneumothorax.  Continue ibuprofen 800mg  TID with food in addition to prn: Meds ordered this encounter  Medications  . HYDROcodone-acetaminophen (NORCO/VICODIN) 5-325 MG tablet    Sig: Take 1 tablet by mouth every 6 (six) hours as needed for moderate pain or severe pain.    Dispense:  10 tablet    Refill:  0   Work note with light duty recommendation provided. Activities as tolerated.  [place] Controlled Substances Registry consulted for this patient. I feel the risk/benefit ratio today is favorable for proceeding with this prescription for a controlled substance. Medication sedation precautions given.  Reviewed expectations re: course of current medical issues. Questions answered. Outlined signs and symptoms indicating need for more acute intervention. Patient verbalized understanding. After Visit Summary given.  SUBJECTIVE: History from: patient. Colin Davidson is a 38 y.o. male who reports persistent moderate pain of his left ribs; described as aching without radiation. Onset: abrupt, a week ago. Injury/trama: reports fall from ladder while 1 foot above ground; landed on L ribs/side; immediate pain. Symptoms have progressed to a point and plateaued since beginning. "Just not getting better." No trouble breathing. Aggravating factors: movement and deep breaths. Alleviating factors: rest. Associated symptoms: none reported. Extremity sensation changes or weakness: none. Self treatment: tried OTCs without relief of pain. History of similar: no.  History reviewed. No pertinent surgical history.   ROS: As per HPI. All other systems negative.    OBJECTIVE:  Vitals:   09/06/18 1128  BP: 134/76  Pulse: 66  Resp: 18  Temp: 98.5 F (36.9 C)  SpO2: 98%     General appearance: alert; no distress HEENT: [place]; AT Neck: supple with FROM CV: RRR Resp: CTAB; unlabored respirations Chest wall: tender to palpation over L anterior and side; no bruising Extremities: moving all extremities normally Skin: warm and dry; no visible rashes Neurologic: gait normal; normal reflexes of RUE and LUE; normal sensation of RUE and LUE; normal strength of RUE and LUE Psychological: alert and cooperative; normal mood and affect  Imaging: Dg Ribs Unilateral W/chest Left  Result Date: 09/06/2018 CLINICAL DATA:  LEFT side rib pain, fell 6 days ago EXAM: LEFT RIBS AND CHEST - 3+ VIEW COMPARISON:  Chest radiographs 01/17/2018 FINDINGS: Normal heart size, mediastinal contours, and pulmonary vascularity. Lungs clear. No infiltrate, pleural effusion or pneumothorax. Osseous mineralization normal. BB placed at site of symptoms lower LEFT chest. No rib fracture or bone destruction. IMPRESSION: Normal exam. Electronically Signed   By: Ulyses SouthwardMark  Boles M.D.   On: 09/06/2018 12:16      Allergies  Allergen Reactions  . Chantix [Varenicline]     Past Medical History:  Diagnosis Date  . Chest pain syndrome   . Drug abuse, cocaine type (HCC)   . Inguinal hernia   . Palpitations   . PUD (peptic ulcer disease)    Social History   Socioeconomic History  . Marital status: Single    Spouse name: Not on file  . Number of children: Not on file  . Years of education: Not on file  . Highest education level: Not on file  Occupational History  . Not on file  Social Needs  . Financial resource strain: Not on file  . Food insecurity    Worry: Not on file  Inability: Not on file  . Transportation needs    Medical: Not on file    Non-medical: Not on file  Tobacco Use  . Smoking status: Current Every Day Smoker  Substance and Sexual Activity  . Alcohol use: No  . Drug use: No  . Sexual activity: Not on file  Lifestyle  . Physical activity    Days per week: Not on file     Minutes per session: Not on file  . Stress: Not on file  Relationships  . Social Herbalist on phone: Not on file    Gets together: Not on file    Attends religious service: Not on file    Active member of club or organization: Not on file    Attends meetings of clubs or organizations: Not on file    Relationship status: Not on file  Other Topics Concern  . Not on file  Social History Narrative  . Not on file   Family History  Family history unknown: Yes   History reviewed. No pertinent surgical history.    Vanessa Kick, MD 09/06/18 1452

## 2018-09-06 NOTE — Discharge Instructions (Addendum)
Be aware, pain medications may cause drowsiness. Please do not drive, operate heavy machinery or make important decisions while on this medication, it can cloud your judgement.  

## 2018-09-06 NOTE — ED Triage Notes (Signed)
Pt states he fell off a ladder, only a foot or so, but landed on his L rib area, states it happened a week ago but its still causing him a lot of discomfort.

## 2018-09-30 MED FILL — AMPHETAMINE-DEXTROAMPHETAMI: 30 | 30 days supply | Qty: 60 | Fill #0

## 2018-11-01 MED FILL — AMPHETAMINE-DEXTROAMPHETAMI: 30 | 30 days supply | Qty: 60 | Fill #0

## 2018-12-01 MED FILL — AMPHETAMINE-DEXTROAMPHETAMI: 30 | 30 days supply | Qty: 60 | Fill #0

## 2019-01-02 MED FILL — AMPHETAMINE-DEXTROAMPHETAMI: 30 | 30 days supply | Qty: 60 | Fill #0

## 2019-01-30 MED FILL — AMPHETAMINE-DEXTROAMPHETAMI: 30 | 30 days supply | Qty: 60 | Fill #0

## 2019-02-27 MED FILL — AMPHETAMINE-DEXTROAMPHETAMI: 30 | 30 days supply | Qty: 60 | Fill #0

## 2019-02-27 MED FILL — SUMATRIPTAN SUCC 100 MG TAB: 100 | 30 days supply | Qty: 10 | Fill #0

## 2019-04-01 MED FILL — AMPHETAMINE-DEXTROAMPHETAMI: 30 | 30 days supply | Qty: 60 | Fill #0

## 2019-04-01 MED FILL — SUMATRIPTAN SUCC 100 MG TAB: 100 | 30 days supply | Qty: 10 | Fill #1

## 2019-04-27 ENCOUNTER — Telehealth: Payer: Self-pay | Admitting: Psychiatry

## 2019-04-27 NOTE — Telephone Encounter (Signed)
Error

## 2019-05-01 MED FILL — SUMATRIPTAN SUCC 100 MG TAB: 100 | 30 days supply | Qty: 10 | Fill #2

## 2019-05-01 MED FILL — AMPHETAMINE-DEXTROAMPHETAMI: 30 | 30 days supply | Qty: 60 | Fill #0

## 2019-05-03 ENCOUNTER — Ambulatory Visit: Payer: Self-pay | Admitting: Psychiatry

## 2019-05-04 ENCOUNTER — Ambulatory Visit (INDEPENDENT_AMBULATORY_CARE_PROVIDER_SITE_OTHER): Payer: 59 | Admitting: Psychiatry

## 2019-05-04 ENCOUNTER — Other Ambulatory Visit: Payer: Self-pay

## 2019-05-04 ENCOUNTER — Encounter: Payer: Self-pay | Admitting: Psychiatry

## 2019-05-04 DIAGNOSIS — F411 Generalized anxiety disorder: Secondary | ICD-10-CM
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
## 2019-06-01 MED FILL — AMPHETAMINE-DEXTROAMPHETAMI: 30 | 30 days supply | Qty: 60 | Fill #0

## 2019-06-01 MED FILL — SUMATRIPTAN SUCC 100 MG TAB: 100 | 30 days supply | Qty: 10 | Fill #3

## 2019-06-08 ENCOUNTER — Ambulatory Visit: Payer: 59 | Admitting: Psychiatry

## 2019-06-16 ENCOUNTER — Ambulatory Visit (INDEPENDENT_AMBULATORY_CARE_PROVIDER_SITE_OTHER): Payer: 59 | Admitting: Psychiatry

## 2019-06-16 ENCOUNTER — Other Ambulatory Visit: Payer: Self-pay

## 2019-06-16 ENCOUNTER — Encounter: Payer: Self-pay | Admitting: Psychiatry

## 2019-06-16 DIAGNOSIS — F411 Generalized anxiety disorder: Secondary | ICD-10-CM

## 2019-06-16 NOTE — Progress Notes (Signed)
      [facility] Counselor/Therapist Progress Note  Patient ID: Colin Davidson, MRN: [id number],    Date: 06/16/2019  Time Spent: 50 minutes   Treatment Type: Individual Therapy  Reported Symptoms: anxiety  Mental Status Exam:  Appearance:   Casual     Behavior:  Appropriate  Motor:  Normal  Speech/Language:   Clear and Coherent  Affect:  Appropriate  Mood:  anxious  Thought process:  normal  Thought content:    WNL  Sensory/Perceptual disturbances:    WNL  Orientation:  oriented to person, place, time/date and situation  Attention:  Good  Concentration:  Good  Memory:  WNL  Fund of knowledge:   Good  Insight:    Good  Judgment:   Good  Impulse Control:  Good   Risk Assessment: Danger to Self:  No Self-injurious Behavior: No Danger to Others: No Duty to Warn:no Physical Aggression / Violence:No  Access to Firearms a concern: No  Gang Involvement:No   Subjective: "I felt so much relief after last session."  The client has been able to manage his relationship with his ex-wife more effectively.  He has decided that he can no longer be in her life.  He has started another relationship he is very invested in.  He plans on telling his ex in-laws this weekend.  He is worried about his ex-wife attempting to take her life again.  I used eye-movement with the client focusing on his fear.  His subjective units of distress was 4.  As the client processed the main fear that came up was that she would hurt herself.  He also feels like there will be a personal consequence for him because they have been connected for so long.  In addition he realized that he likes his new relationship and wants to pursue that.  As long as he is in relationship with his ex-wife it continues to hurt him.  The client's subjective units of distress was less than 1 when we finished.  His positive cognition was, "I can say goodbye to her." The client has also put an offer on a house in [place], [place].  He  hopes to hear today if the offer was accepted.  He also plans to ask his new girlfriend to move in with him.  I cautioned the client that he might be moving a little bit too quickly.  He understands but is going to pursue this anyway.  Interventions: Assertiveness/Communication, Motivational Interviewing, Solution-Oriented/Positive Psychology, [name] and Insight-Oriented  Diagnosis:   ICD-10-CM   1. Generalized anxiety disorder  F41.1     Plan: Boundaries, assertiveness, self-care, positive self talk, radical acceptance.  Gelene Mink Teesha Ohm, [facility]

## 2019-06-23 ENCOUNTER — Ambulatory Visit (INDEPENDENT_AMBULATORY_CARE_PROVIDER_SITE_OTHER): Payer: 59 | Admitting: Psychiatry

## 2019-06-23 ENCOUNTER — Other Ambulatory Visit: Payer: Self-pay

## 2019-06-23 ENCOUNTER — Encounter: Payer: Self-pay | Admitting: Psychiatry

## 2019-06-23 DIAGNOSIS — F411 Generalized anxiety disorder: Secondary | ICD-10-CM

## 2019-06-23 NOTE — Progress Notes (Signed)
      [facility] Counselor/Therapist Progress Note  Patient ID: Colin Davidson, MRN: [id number],    Date: 06/23/2019  Time Spent: 50 minutes   Treatment Type: Individual Therapy  Reported Symptoms: anxious  Mental Status Exam:  Appearance:   Casual     Behavior:  Appropriate  Motor:  Normal  Speech/Language:   Clear and Coherent  Affect:  Appropriate  Mood:  anxious  Thought process:  normal  Thought content:    WNL  Sensory/Perceptual disturbances:    WNL  Orientation:  oriented to person, place, time/date and situation  Attention:  Good  Concentration:  Good  Memory:  WNL  Fund of knowledge:   Good  Insight:    Good  Judgment:   Good  Impulse Control:  Good   Risk Assessment: Danger to Self:  No Self-injurious Behavior: No Danger to Others: No Duty to Warn:no Physical Aggression / Violence:No  Access to Firearms a concern: No  Gang Involvement:No   Subjective: The client stated that today he wanted to work on his boundaries with his ex-wife.  She had recently attempted suicide which the client experienced as very traumatic.  "What are going to be my boundaries?"  I used eye-movement with the client focusing on his ex-wife.  His emotions were guilt and responsibility.  His subjective units of distress was a 5.  As the client processed he came up with 3 clear boundaries.  "We will have no sex.  I will not caretake.  I cannot be her husband."  The client is wondering if he can just be his ex-wife's friend?  I explained to the client with his history that probably would not work out well.  He agreed.  I pointed out that with his sexually addictive behavior if she offered him sex he would fall into it.  He agreed.  As we continued to process the client realized how his sex addiction correlated to his alcohol use.  "I use it to change the way I feel."  As we continued to process this the client saw that he needed to fill himself up and used sex to do it.  I asked the client what  he was trying to fill?  He was unsure.  We did discuss that this was probably a spiritual issue.  He agreed.  As the session came to a close, the client wanted to make sure that we processed through the trauma of his ex-wife's suicide attempt.  His subjective units of distress at the end of the session was less than 3.  Interventions: Assertiveness/Communication, Motivational Interviewing, Solution-Oriented/Positive Psychology, [name] and Insight-Oriented  Diagnosis:   ICD-10-CM   1. Generalized anxiety disorder  F41.1     Plan: Mood independent behavior, boundaries, assertiveness, self-care, sexual sobriety, read more about codependence.  Gelene Mink Sania Noy, [facility]

## 2019-07-31 ENCOUNTER — Encounter: Payer: Self-pay | Admitting: Psychiatry

## 2019-07-31 ENCOUNTER — Ambulatory Visit (INDEPENDENT_AMBULATORY_CARE_PROVIDER_SITE_OTHER): Payer: 59 | Admitting: Psychiatry

## 2019-07-31 ENCOUNTER — Other Ambulatory Visit: Payer: Self-pay

## 2019-07-31 DIAGNOSIS — F411 Generalized anxiety disorder: Secondary | ICD-10-CM

## 2019-07-31 NOTE — Progress Notes (Signed)
      [facility] Counselor/Therapist Progress Note  Patient ID: Colin Davidson, MRN: [id number],    Date: 07/31/2019  Time Spent: 50 minutes   Treatment Type: Individual Therapy  Reported Symptoms: anxiety  Mental Status Exam:  Appearance:   Casual     Behavior:  Appropriate  Motor:  Normal  Speech/Language:   Clear and Coherent  Affect:  Appropriate  Mood:  anxious  Thought process:  normal  Thought content:    WNL  Sensory/Perceptual disturbances:    WNL  Orientation:  oriented to person, place, time/date and situation  Attention:  Good  Concentration:  Good  Memory:  WNL  Fund of knowledge:   Good  Insight:    Good  Judgment:   Good  Impulse Control:  Good   Risk Assessment: Danger to Self:  No Self-injurious Behavior: No Danger to Others: No Duty to Warn:no Physical Aggression / Violence:No  Access to Firearms a concern: No  Gang Involvement:No   Subjective: The client reports that he and his girlfriend got married last weekend.  He states that she is [redacted] weeks pregnant.  They had recently got into a conflict and at my suggestion is reading the book codependent no more.  "It has my name all over it."  The client realized that when he gets anxious he can get angry and verbally abusive.  This happens when he is texting with his wife.  I went over the handout on common thinking errors with the client.  I explained that text messaging can be a very poor way to communicate clearly.  He realized that he tends to misinterpret things and then read more into it. Today I used eye-movement with the client focusing on his defensiveness.  As the client processed he stated he felt anxious about working on it.  He realizes that it makes him more vulnerable.  I discussed with the client how in the past he used his substance abuse as a way to protect himself and avoid feelings.  He agreed.  He has been sober for a number of years now and realizes also that he needs to address his sexual  addiction as well.  As the client continued to process he felt his anxiety go from his subjective units of distress of 5 to less than 1.  He will continue to review "codependent no more" and [name] attend Al-Anon meetings.  Interventions: Assertiveness/Communication, Motivational Interviewing, Solution-Oriented/Positive Psychology, [name] and Insight-Oriented  Diagnosis:   ICD-10-CM   1. Generalized anxiety disorder  F41.1     Plan: Al-Anon meetings, boundaries, assertiveness, positive self talk, self-care, mood independent behavior, review thinking errors handout.  [name], [facility]

## 2019-08-03 MED FILL — AMPHETAMINE-DEXTROAMPHETAMI: 30 | 30 days supply | Qty: 60 | Fill #0

## 2019-08-16 ENCOUNTER — Other Ambulatory Visit: Payer: Self-pay

## 2019-08-16 ENCOUNTER — Encounter (HOSPITAL_COMMUNITY): Payer: Self-pay

## 2019-08-16 ENCOUNTER — Ambulatory Visit (HOSPITAL_COMMUNITY)
Admission: RE | Admit: 2019-08-16 | Discharge: 2019-08-16 | Disposition: A | Discharge status: Home / Self Care | Payer: 59 | Source: Ambulatory Visit | Attending: Emergency Medicine | Admitting: Emergency Medicine

## 2019-08-16 VITALS — BP 103/67 | HR 77 | Temp 98.3°F | Resp 18

## 2019-08-16 DIAGNOSIS — R109 Unspecified abdominal pain: Secondary | ICD-10-CM | POA: Insufficient documentation

## 2019-08-16 LAB — CBC
HCT: 50 % (ref 39.0–52.0)
Hemoglobin: 16.7 g/dL (ref 13.0–17.0)
MCH: 31.2 pg (ref 26.0–34.0)
MCHC: 33.4 g/dL (ref 30.0–36.0)
MCV: 93.3 fL (ref 80.0–100.0)
Platelets: 292 10*3/uL (ref 150–400)
RBC: 5.36 MIL/uL (ref 4.22–5.81)
RDW: 12.9 % (ref 11.5–15.5)
WBC: 9.2 10*3/uL (ref 4.0–10.5)
nRBC: 0 % (ref 0.0–0.2)

## 2019-08-16 LAB — POCT URINALYSIS DIP (DEVICE)
Bilirubin Urine: NEGATIVE
Glucose, UA: NEGATIVE mg/dL
Ketones, ur: NEGATIVE mg/dL
Leukocytes,Ua: NEGATIVE
Nitrite: NEGATIVE
Protein, ur: NEGATIVE mg/dL
Specific Gravity, Urine: 1.025 (ref 1.005–1.030)
Urobilinogen, UA: 0.2 mg/dL (ref 0.0–1.0)
pH: 6 (ref 5.0–8.0)

## 2019-08-16 LAB — COMPREHENSIVE METABOLIC PANEL
ALT: 18 U/L (ref 0–44)
AST: 22 U/L (ref 15–41)
Albumin: 4.2 g/dL (ref 3.5–5.0)
Alkaline Phosphatase: 82 U/L (ref 38–126)
Anion gap: 8 (ref 5–15)
BUN: 11 mg/dL (ref 6–20)
CO2: 28 mmol/L (ref 22–32)
Calcium: 9.1 mg/dL (ref 8.9–10.3)
Chloride: 101 mmol/L (ref 98–111)
Creatinine, Ser: 0.87 mg/dL (ref 0.61–1.24)
GFR calc Af Amer: >60 mL/min (ref >60)
GFR calc non Af Amer: >60 mL/min (ref >60)
Glucose, Bld: 85 mg/dL (ref 70–99)
Potassium: 4.1 mmol/L (ref 3.5–5.1)
Sodium: 137 mmol/L (ref 135–145)
Total Bilirubin: 0.9 mg/dL (ref 0.3–1.2)
Total Protein: 6.9 g/dL (ref 6.5–8.1)

## 2019-08-16 MED ORDER — KETOROLAC TROMETHAMINE 60 MG/2ML IM SOLN
INTRAMUSCULAR | Status: AC
  Filled 2019-08-16: qty 2

## 2019-08-16 MED ORDER — KETOROLAC TROMETHAMINE 60 MG/2ML IM SOLN
60.0000 mg | Freq: Once | INTRAMUSCULAR | Status: AC
  Administered 2019-08-16: 60 mg via INTRAMUSCULAR

## 2019-08-16 MED ORDER — IBUPROFEN 800 MG PO TABS: 800.0000 mg | ORAL_TABLET | Freq: Three times a day (TID) | ORAL | 0 refills | Status: DC

## 2019-08-16 NOTE — ED Triage Notes (Signed)
Pt sts left sided flank to back pain that started as spasm last night but has continued through today denies urinary sx

## 2019-08-16 NOTE — Discharge Instructions (Signed)
You do have microscopic blood to your urine, which makes me highly suspicious of kidney stone, considering what you have shared with me.  We will check to ensure your kidney function looks well, I will call you if this returns concerning.  I feel the injection we give you tonight should help with pain.  Ibuprofen every 8 hours as needed for pain.  Drink plenty of water.  If persistent pain please follow up with urology. If severe pain, fevers, or unable to urinate please go to the ER.

## 2019-08-18 NOTE — ED Provider Notes (Signed)
[facility]    CSN: [id number] Arrival date & time: 08/16/19  1743      History   Chief Complaint Chief Complaint  Patient presents with  . Appointment    1800  . Flank Pain    HPI Colin Davidson is a 39 y.o. male.   Colin Davidson presents with complaints of left flank/ mid back soreness. Last night had an episode he describes as a "spasm" which was quite severe, brought him out of bed to try to ease it. Caused nausea without vomiting. Lasted at least 5 minutes and now with a pressure sensation to the area. Doesn't seem to have any colic qualities. Normal urination, no notable blood to urine. No groin, pelvic or testicular pain. No further nausea. No diarrhea or constipation. Denies any previous similar, denies history of kidney stones. Hasn't taken any medication for pain. No known injury or trigger of symptoms. Does not drink alcohol, recovering alcoholic and addict.    ROS per HPI, negative if not otherwise mentioned.      Past Medical History:  Diagnosis Date  . Chest pain syndrome   . Drug abuse, cocaine type ([facility])   . Inguinal hernia   . Palpitations   . PUD (peptic ulcer disease)     There are no problems to display for this patient.   History reviewed. No pertinent surgical history.     Home Medications    Prior to Admission medications   Medication Sig Start Date End Date Taking? Authorizing Provider  albuterol (PROVENTIL HFA;VENTOLIN HFA) 108 (90 Base) MCG/ACT inhaler Inhale 2 puffs into the lungs every 4 (four) hours as needed for wheezing or shortness of breath (cough, shortness of breath or wheezing.). 01/09/18   Elvina Sidle, MD  HYDROcodone-acetaminophen (NORCO/VICODIN) 5-325 MG tablet Take 1 tablet by mouth every 6 (six) hours as needed for moderate pain or severe pain. Patient not taking: Reported on 08/16/2019 09/06/18   Mardella Layman, MD  ibuprofen (ADVIL) 800 MG tablet Take 1 tablet (800 mg total) by mouth 3 (three) times daily.  08/16/19   Georgetta Haber, NP    Family History Family History  Family history unknown: Yes    Social History Social History   Tobacco Use  . Smoking status: Current Every Day Smoker  . Smokeless tobacco: Never Used  Substance Use Topics  . Alcohol use: No    Comment: In recovery, sober 3 years  . Drug use: No    Comment: In recovery, sober 3 years     Allergies   Chantix [varenicline]   Review of Systems Review of Systems   Physical Exam Triage Vital Signs ED Triage Vitals  Enc Vitals Group     BP 08/16/19 1759 103/67     Pulse Rate 08/16/19 1759 77     Resp 08/16/19 1759 18     Temp 08/16/19 1759 98.3 F (36.8 C)     Temp Source 08/16/19 1759 Oral     SpO2 08/16/19 1759 97 %     Weight --      Height --      Head Circumference --      Peak Flow --      Pain Score 08/16/19 1800 7     Pain Loc --      Pain Edu? --      Excl. in GC? --    No data found.  Updated Vital Signs BP 103/67 (BP Location: Right Arm)   Pulse  77   Temp 98.3 F (36.8 C) (Oral)   Resp 18   SpO2 97%    Physical Exam Constitutional:      Appearance: He is well-developed.  Cardiovascular:     Rate and Rhythm: Normal rate.  Pulmonary:     Effort: Pulmonary effort is normal.  Abdominal:     Tenderness: There is no abdominal tenderness. There is no guarding or rebound.     Comments: Very mild left CVA tenderness on exam, patient otherwise sitting comfortably throughout exam   Musculoskeletal:     Thoracic back: Normal.     Lumbar back: Normal.  Skin:    General: Skin is warm and dry.  Neurological:     Mental Status: He is alert and oriented to person, place, and time.      UC Treatments / Results  Labs (all labs ordered are listed, but only abnormal results are displayed) Labs Reviewed  POCT URINALYSIS DIP (DEVICE) - Abnormal; Notable for the following components:      Result Value   Hgb urine dipstick LARGE (*)    All other components within normal limits  CBC    COMPREHENSIVE METABOLIC PANEL    EKG   Radiology No results found.  Procedures Procedures (including critical care time)  Medications Ordered in UC Medications  ketorolac (TORADOL) injection 60 mg (60 mg Intramuscular Given 08/16/19 1853)    Initial Impression / Assessment and Plan / UC Course  I have reviewed the triage vital signs and the nursing notes.  Pertinent labs & imaging results that were available during my care of the patient were reviewed by me and considered in my medical decision making (see chart for details).     Sitting comfortably currently, no further episodes of severe pain, still with some pressure pain to left flank. Large hgb to urine. History and physical consistent with nephrolithiasis. toradol provided. Discussed likely course of symptoms with return precautions and urology prn follow up discussed. Patient verbalized understanding and agreeable to plan.  Ambulatory out of clinic without difficulty.    Final Clinical Impressions(s) / UC Diagnoses   Final diagnoses:  Flank pain     Discharge Instructions     You do have microscopic blood to your urine, which makes me highly suspicious of kidney stone, considering what you have shared with me.  We will check to ensure your kidney function looks well, I will call you if this returns concerning.  I feel the injection we give you tonight should help with pain.  Ibuprofen every 8 hours as needed for pain.  Drink plenty of water.  If persistent pain please follow up with urology. If severe pain, fevers, or unable to urinate please go to the ER.    ED Prescriptions    Medication Sig Dispense Auth. Provider   ibuprofen (ADVIL) 800 MG tablet Take 1 tablet (800 mg total) by mouth 3 (three) times daily. 30 tablet Georgetta Haber, NP     PDMP not reviewed this encounter.   Georgetta Haber, NP 08/18/19 [phone]

## 2019-08-22 ENCOUNTER — Ambulatory Visit: Payer: 59 | Admitting: Psychiatry

## 2019-09-05 ENCOUNTER — Ambulatory Visit (INDEPENDENT_AMBULATORY_CARE_PROVIDER_SITE_OTHER): Payer: 59 | Admitting: Psychiatry

## 2019-09-05 ENCOUNTER — Other Ambulatory Visit: Payer: Self-pay

## 2019-09-05 DIAGNOSIS — F411 Generalized anxiety disorder: Secondary | ICD-10-CM

## 2019-09-06 ENCOUNTER — Encounter: Payer: Self-pay | Admitting: Psychiatry

## 2019-09-06 NOTE — Progress Notes (Signed)
[facility] Counselor/Therapist Progress Note  Patient ID: Colin Davidson, MRN: [id number],    Date: 09/05/2019  Time Spent: 50 minutes   Treatment Type: Individual Therapy  Reported Symptoms: anxiety, anger  Mental Status Exam:  Appearance:   Casual     Behavior:  Appropriate  Motor:  Normal  Speech/Language:   Clear and Coherent  Affect:  Appropriate  Mood:  angry and anxious  Thought process:  normal  Thought content:    WNL  Sensory/Perceptual disturbances:    WNL  Orientation:  oriented to person, place, time/date and situation  Attention:  Good  Concentration:  Good  Memory:  WNL  Fund of knowledge:   Good  Insight:    Good  Judgment:   Good  Impulse Control:  Good   Risk Assessment: Danger to Self:  No Self-injurious Behavior: No Danger to Others: No Duty to Warn:no Physical Aggression / Violence:No  Access to Firearms a concern: No  Gang Involvement:No   Subjective: The client states that his wife is [redacted] weeks pregnant.  He came home and found her drunk.  The next day she would not talk about it.  The client stated he looked in her phone and found that she had called her ex- drug dealer boyfriend.  She had also googled how to have a miscarriage.  "I got angry and left."  Client states he went and talked to his old sponsor and others in recovery.  He calmed down and came home and discussed with her that continuing to get drunk or use drugs was unacceptable.  He admitted that he was overly harsh towards her.  "I required her to go into a recovery program."  The client states his wife is now going to NA and has a sponsor that she speaks with daily.  The client has also set her up with an online counselor that she has weekly sessions with but can text all the time.  He states this has helped and his wife seems better.  He did tell her that he put a tracker on her car.  He states, "that is my car and if you use it for drugs you lose the use of it." Today I used  eye-movement with the client around this circumstance.  His negative cognition is, "I feel crazy."  He feels anxiety in his chest.  His subjective units of distress is a 6.  As the client processed he stated he was working on radical acceptance to be prepared for what ever Geoffery Aultman happen.  His wife has decided not to terminate the pregnancy.  "If it all goes south I have to be okay."  In the middle of this the client had to put his cat to sleep of 22 years.  "That made me very sad" the client realizes that he is codependent.  He has returned to [employer] and meeting with his sponsor as well.  He wants to work towards being interdependent than codependent.  He acknowledged that he does love her which gives him hope and trust.  I asked the client to focus on this going forward.  He agreed.  His subjective units of distress was less than 2 at the end of the session.  Interventions: Assertiveness/Communication, Motivational Interviewing, Solution-Oriented/Positive Psychology, [name] and Insight-Oriented  Diagnosis:   ICD-10-CM   1. Generalized anxiety disorder  F41.1     Plan: Mood independent behavior, AA meetings, meet with sponsor, assertiveness,  boundaries, positive self talk, self-care, exercise, radical acceptance.  Gelene Mink Elih Mooney, [facility]

## 2019-09-19 ENCOUNTER — Ambulatory Visit: Payer: 59 | Admitting: Psychiatry

## 2019-10-03 ENCOUNTER — Encounter: Payer: Self-pay | Admitting: Psychiatry

## 2019-10-03 ENCOUNTER — Other Ambulatory Visit: Payer: Self-pay

## 2019-10-03 ENCOUNTER — Ambulatory Visit (INDEPENDENT_AMBULATORY_CARE_PROVIDER_SITE_OTHER): Payer: 59 | Admitting: Psychiatry

## 2019-10-03 DIAGNOSIS — F411 Generalized anxiety disorder: Secondary | ICD-10-CM | POA: Diagnosis not present

## 2019-10-03 NOTE — Progress Notes (Signed)
[facility] Counselor/Therapist Progress Note  Patient ID: KHYRON GARNO, MRN: [id number],    Date: 10/03/2019  Time Spent: 50 minutes   Treatment Type: Individual Therapy  Reported Symptoms: anxiety, irritable  Mental Status Exam:  Appearance:   Casual     Behavior:  Appropriate  Motor:  Normal  Speech/Language:   Clear and Coherent  Affect:  Appropriate  Mood:  anxious and irritable  Thought process:  normal  Thought content:    WNL  Sensory/Perceptual disturbances:    WNL  Orientation:  oriented to person, place, time/date and situation  Attention:  Good  Concentration:  Good  Memory:  WNL  Fund of knowledge:   Good  Insight:    Good  Judgment:   Good  Impulse Control:  Good   Risk Assessment: Danger to Self:  No Self-injurious Behavior: No Danger to Others: No Duty to Warn:no Physical Aggression / Violence:No  Access to Firearms a concern: No  Gang Involvement:No   Subjective: The client states that things went sideways with his wife over the weekend.  He states he ended up spending 2 nights in a hotel.  The issue was that one of her friends visited who drinks and does drugs.  She drove him to the drugstore and to get beer.  This was a violation of the boundaries at the client thought he had set with his wife.  "She should not be driving anyone to get beer."  The client thinks he is very much in a reaction mood with his wife since her relapse 6 weeks ago.  He is very worried about the baby and the wife is at 16 weeks.  The client admits that she has not relapsed and she is still meeting with her sponsor. Today we started with eye-movement focusing on the client's wife.  His negative cognition is, "I am terrified.  I feel the need to control her."  He feels shame, resentment, irritability and anxiety in his stomach.  His subjective units of distress is a 7.  As the client started to process using eye-movement he stated, "I am coming from a place of fear."  The  client admits that when he becomes afraid he starts overreacting.  Then he verbally begins to bully or intimidate his wife into submission.  This time she fought back and would not allow him to do so.  As we continued to process I asked the client how he would feel if someone did to him what he did to his wife this past weekend?  He states he would have not responded well.  I then discussed with him more effective ways of approaching his wife.  He agrees that he needs to forgive her.  "I cannot do what I did this past weekend."  The client's reaction to his wife was all verbal and there was no physical violence.  At the end of the session his subjective units of distress was less than 2.  He has agreed to follow-up with a marriage counselor, Corine Shelter, LCSW.  Client also was recently prescribed Wellbutrin by his physician.  He will be starting that soon.  Interventions: Assertiveness/Communication, Motivational Interviewing, Solution-Oriented/Positive Psychology, [name] and Insight-Oriented  Diagnosis:   ICD-10-CM   1. Generalized anxiety disorder  F41.1     Plan: Mood independent behavior, positive self talk, self-care, boundaries, assertiveness, marriage counseling, start Wellbutrin.  Gelene Mink Odilon Cass, [facility]

## 2019-10-08 ENCOUNTER — Encounter (HOSPITAL_COMMUNITY): Payer: Self-pay

## 2019-10-08 ENCOUNTER — Ambulatory Visit (HOSPITAL_COMMUNITY)
Admission: EM | Admit: 2019-10-08 | Discharge: 2019-10-08 | Disposition: A | Discharge status: Home / Self Care | Payer: 59 | Attending: Urgent Care | Admitting: Urgent Care

## 2019-10-08 ENCOUNTER — Other Ambulatory Visit: Payer: Self-pay

## 2019-10-08 DIAGNOSIS — R102 Pelvic and perineal pain: Secondary | ICD-10-CM | POA: Insufficient documentation

## 2019-10-08 DIAGNOSIS — N3000 Acute cystitis without hematuria: Secondary | ICD-10-CM | POA: Diagnosis present

## 2019-10-08 DIAGNOSIS — R300 Dysuria: Secondary | ICD-10-CM | POA: Diagnosis not present

## 2019-10-08 LAB — POCT URINALYSIS DIPSTICK, ED / UC
Bilirubin Urine: NEGATIVE
Glucose, UA: NEGATIVE mg/dL
Ketones, ur: NEGATIVE mg/dL
Leukocytes,Ua: NEGATIVE
Nitrite: POSITIVE — AB
Protein, ur: NEGATIVE mg/dL
Specific Gravity, Urine: 1.025 (ref 1.005–1.030)
Urobilinogen, UA: 0.2 mg/dL (ref 0.0–1.0)
pH: 7 (ref 5.0–8.0)

## 2019-10-08 MED ORDER — KETOROLAC TROMETHAMINE 60 MG/2ML IM SOLN
60.0000 mg | Freq: Once | INTRAMUSCULAR | Status: AC
  Administered 2019-10-08: 60 mg via INTRAMUSCULAR

## 2019-10-08 MED ORDER — KETOROLAC TROMETHAMINE 60 MG/2ML IM SOLN
INTRAMUSCULAR | Status: AC
  Filled 2019-10-08: qty 2

## 2019-10-08 MED ORDER — SULFAMETHOXAZOLE-TRIMETHOPRIM 800-160 MG PO TABS: 1.0000 | ORAL_TABLET | Freq: Two times a day (BID) | ORAL | 0 refills | Status: DC

## 2019-10-08 MED ORDER — CELECOXIB 200 MG PO CAPS: 200.0000 mg | ORAL_CAPSULE | Freq: Two times a day (BID) | ORAL | 0 refills | Status: DC

## 2019-10-08 NOTE — ED Provider Notes (Signed)
[facility]   MRN: [id number] DOB: 02-20-1980  Subjective:   Colin Davidson is a 39 y.o. male presenting for 1 week hx of dysuria, urinary frequency. States that he feels like he has had long term intermittent pelvic pressure, pelvic/perianal fullness.  Patient has previously had to see a urologist at [facility] urology many years ago for problem with the testicles.  Has not had sequelae from this.  He has been using an NSAID to help with his pain but does have a history of peptic ulcer disease.  Would like to have Toradol injection to help with his pain.  Denies taking chronic medications.  Allergies  Allergen Reactions  . Chantix [Varenicline]     Past Medical History:  Diagnosis Date  . Chest pain syndrome   . Drug abuse, cocaine type (HCC)   . Inguinal hernia   . Palpitations   . PUD (peptic ulcer disease)      History reviewed. No pertinent surgical history.  Family History  Family history unknown: Yes    Social History   Tobacco Use  . Smoking status: Current Every Day Smoker  . Smokeless tobacco: Never Used  Substance Use Topics  . Alcohol use: No    Comment: In recovery, sober 3 years  . Drug use: No    Comment: In recovery, sober 3 years    ROS   Objective:   Vitals: BP 115/75 (BP Location: Left Arm)   Pulse 68   Temp 98.7 F (37.1 C) (Oral)   Resp 18   SpO2 96%   Physical Exam Constitutional:      General: He is not in acute distress.    Appearance: Normal appearance. He is well-developed and normal weight. He is not ill-appearing, toxic-appearing or diaphoretic.  HENT:     Head: Normocephalic and atraumatic.     Right Ear: External ear normal.     Left Ear: External ear normal.     Nose: Nose normal.     Mouth/Throat:     Pharynx: Oropharynx is clear.  Eyes:     General: No scleral icterus.       Right eye: No discharge.        Left eye: No discharge.     Extraocular Movements: Extraocular movements intact.     Pupils: Pupils  are equal, round, and reactive to light.  Cardiovascular:     Rate and Rhythm: Normal rate.  Pulmonary:     Effort: Pulmonary effort is normal.  Abdominal:     General: Bowel sounds are normal. There is no distension.     Palpations: Abdomen is soft. There is no mass.     Tenderness: There is no abdominal tenderness. There is no right CVA tenderness, left CVA tenderness, guarding or rebound.  Musculoskeletal:     Cervical back: Normal range of motion.  Skin:    General: Skin is warm and dry.  Neurological:     Mental Status: He is alert and oriented to person, place, and time.  Psychiatric:        Mood and Affect: Mood normal.        Behavior: Behavior normal.        Thought Content: Thought content normal.        Judgment: Judgment normal.     Results for orders placed or performed during the hospital encounter of 10/08/19 (from the past 24 hour(s))  POC Urinalysis dipstick     Status: Abnormal   Collection Time: 10/08/19  4:57 PM  Result Value Ref Range   Glucose, UA NEGATIVE NEGATIVE mg/dL   Bilirubin Urine NEGATIVE NEGATIVE   Ketones, ur NEGATIVE NEGATIVE mg/dL   Specific Gravity, Urine 1.025 1.005 - 1.030   Hgb urine dipstick TRACE (A) NEGATIVE   pH 7.0 5.0 - 8.0   Protein, ur NEGATIVE NEGATIVE mg/dL   Urobilinogen, UA 0.2 0.0 - 1.0 mg/dL   Nitrite POSITIVE (A) NEGATIVE   Leukocytes,Ua NEGATIVE NEGATIVE    Assessment and Plan :   PDMP not reviewed this encounter.  1. Acute cystitis without hematuria   2. Dysuria   3. Pelvic pain in male     Patient was in agreement to defer digital rectal exam to rule out prostatitis given risk of septicemia.  Start Bactrim to cover for acute cystitis, hydrate aggressively.  Urine culture pending.  Follow-up with [facility] for recheck. Counseled patient on potential for adverse effects with medications prescribed/recommended today, ER and return-to-clinic precautions discussed, patient verbalized understanding.    Wallis Bamberg, PA-C 10/08/19 1714

## 2019-10-08 NOTE — Discharge Instructions (Addendum)
Start Bactrim twice daily for 10 days to address your urinary tract infection. Use celecoxib for pain and inflammation. Follow up with [facility] for recheck and evaluation for prostatitis. Make sure you hydrate very well with plain water and a quantity of 64 ounces of water a day.  Please limit drinks that are considered urinary irritants such as soda, sweet tea, coffee, energy drinks, alcohol.  These can worsen your UTI symptoms and also be the source of them.  I will let you know about your urine culture results through MyChart to see if we need to change your antibiotics based off of those results.

## 2019-10-08 NOTE — ED Triage Notes (Addendum)
Pt presents with burning when urinating, increased urinary frequency x 1 week. States he feels his prostate is swelling. Ibuprofen 800 mg and AZO gives relief.   Pt is taking amoxicillin he found in his house.

## 2019-10-09 ENCOUNTER — Encounter (HOSPITAL_COMMUNITY): Payer: Self-pay

## 2019-10-09 ENCOUNTER — Other Ambulatory Visit: Payer: Self-pay

## 2019-10-09 ENCOUNTER — Emergency Department (HOSPITAL_COMMUNITY): Payer: 59

## 2019-10-09 ENCOUNTER — Emergency Department (HOSPITAL_COMMUNITY)
Admission: EM | Admit: 2019-10-09 | Discharge: 2019-10-09 | Disposition: A | Discharge status: Home / Self Care | Payer: 59 | Attending: Emergency Medicine | Admitting: Emergency Medicine

## 2019-10-09 DIAGNOSIS — N1330 Unspecified hydronephrosis: Secondary | ICD-10-CM | POA: Insufficient documentation

## 2019-10-09 DIAGNOSIS — R112 Nausea with vomiting, unspecified: Secondary | ICD-10-CM | POA: Diagnosis present

## 2019-10-09 DIAGNOSIS — N23 Unspecified renal colic: Secondary | ICD-10-CM

## 2019-10-09 DIAGNOSIS — F17200 Nicotine dependence, unspecified, uncomplicated: Secondary | ICD-10-CM | POA: Insufficient documentation

## 2019-10-09 LAB — URINALYSIS, ROUTINE W REFLEX MICROSCOPIC
Bacteria, UA: NONE SEEN
Bilirubin Urine: NEGATIVE
Glucose, UA: NEGATIVE mg/dL
Ketones, ur: NEGATIVE mg/dL
Leukocytes,Ua: NEGATIVE
Nitrite: POSITIVE — AB
Protein, ur: NEGATIVE mg/dL
Specific Gravity, Urine: 1.015 (ref 1.005–1.030)
pH: 6 (ref 5.0–8.0)

## 2019-10-09 LAB — COMPREHENSIVE METABOLIC PANEL
ALT: 15 U/L (ref 0–44)
AST: 21 U/L (ref 15–41)
Albumin: 3.9 g/dL (ref 3.5–5.0)
Alkaline Phosphatase: 73 U/L (ref 38–126)
Anion gap: 11 (ref 5–15)
BUN: 22 mg/dL — ABNORMAL HIGH (ref 6–20)
CO2: 25 mmol/L (ref 22–32)
Calcium: 8.4 mg/dL — ABNORMAL LOW (ref 8.9–10.3)
Chloride: 102 mmol/L (ref 98–111)
Creatinine, Ser: 1.19 mg/dL (ref 0.61–1.24)
GFR calc Af Amer: >60 mL/min (ref >60)
GFR calc non Af Amer: >60 mL/min (ref >60)
Glucose, Bld: 125 mg/dL — ABNORMAL HIGH (ref 70–99)
Potassium: 3.6 mmol/L (ref 3.5–5.1)
Sodium: 138 mmol/L (ref 135–145)
Total Bilirubin: 0.6 mg/dL (ref 0.3–1.2)
Total Protein: 6.6 g/dL (ref 6.5–8.1)

## 2019-10-09 LAB — CBC
HCT: 45.4 % (ref 39.0–52.0)
Hemoglobin: 15.1 g/dL (ref 13.0–17.0)
MCH: 31.5 pg (ref 26.0–34.0)
MCHC: 33.3 g/dL (ref 30.0–36.0)
MCV: 94.8 fL (ref 80.0–100.0)
Platelets: 318 10*3/uL (ref 150–400)
RBC: 4.79 MIL/uL (ref 4.22–5.81)
RDW: 12.7 % (ref 11.5–15.5)
WBC: 14.4 10*3/uL — ABNORMAL HIGH (ref 4.0–10.5)
nRBC: 0.1 % (ref 0.0–0.2)

## 2019-10-09 LAB — CYTOLOGY, (ORAL, ANAL, URETHRAL) ANCILLARY ONLY
Chlamydia: NEGATIVE
Comment: NEGATIVE
Comment: NEGATIVE
Comment: NORMAL
Neisseria Gonorrhea: NEGATIVE
Trichomonas: NEGATIVE

## 2019-10-09 LAB — LIPASE, BLOOD: Lipase: 27 U/L (ref 11–51)

## 2019-10-09 LAB — URINE CULTURE: Culture: NO GROWTH

## 2019-10-09 MED ORDER — LORAZEPAM 2 MG/ML IJ SOLN
0.5000 mg | Freq: Once | INTRAMUSCULAR | Status: AC
  Administered 2019-10-09: 0.5 mg via INTRAVENOUS
  Filled 2019-10-09: qty 1

## 2019-10-09 MED ORDER — SODIUM CHLORIDE 0.9 % IV BOLUS
2000.0000 mL | Freq: Once | INTRAVENOUS | Status: AC
  Administered 2019-10-09: 2000 mL via INTRAVENOUS

## 2019-10-09 MED ORDER — PANTOPRAZOLE SODIUM 40 MG IV SOLR
40.0000 mg | Freq: Once | INTRAVENOUS | Status: AC
  Administered 2019-10-09: 40 mg via INTRAVENOUS
  Filled 2019-10-09: qty 40

## 2019-10-09 MED ORDER — TAMSULOSIN HCL 0.4 MG PO CAPS: 0.4000 mg | ORAL_CAPSULE | Freq: Every day | ORAL | 0 refills | Status: DC

## 2019-10-09 MED ORDER — HYDROCODONE-ACETAMINOPHEN 7.5-325 MG/15ML PO SOLN: 15.0000 mL | Freq: Four times a day (QID) | ORAL | 0 refills | Status: DC | PRN

## 2019-10-09 MED ORDER — SODIUM CHLORIDE 0.9 % IV SOLN: INTRAVENOUS | Status: DC

## 2019-10-09 MED ORDER — MORPHINE SULFATE (PF) 4 MG/ML IV SOLN
4.0000 mg | Freq: Once | INTRAVENOUS | Status: AC
  Administered 2019-10-09: 4 mg via INTRAVENOUS
  Filled 2019-10-09: qty 1

## 2019-10-09 MED ORDER — METOCLOPRAMIDE HCL 5 MG/ML IJ SOLN
5.0000 mg | Freq: Once | INTRAMUSCULAR | Status: AC
  Administered 2019-10-09: 5 mg via INTRAVENOUS
  Filled 2019-10-09: qty 2

## 2019-10-09 MED ORDER — ONDANSETRON HCL 4 MG/2ML IJ SOLN
4.0000 mg | Freq: Once | INTRAMUSCULAR | Status: AC
  Administered 2019-10-09: 4 mg via INTRAVENOUS
  Filled 2019-10-09: qty 2

## 2019-10-09 MED ORDER — HYDROMORPHONE HCL 1 MG/ML IJ SOLN
0.5000 mg | Freq: Once | INTRAMUSCULAR | Status: AC
  Administered 2019-10-09: 0.5 mg via INTRAVENOUS
  Filled 2019-10-09: qty 1

## 2019-10-09 NOTE — ED Notes (Signed)
Patient transported to CT 

## 2019-10-09 NOTE — ED Notes (Signed)
ED Provider at bedside. 

## 2019-10-09 NOTE — ED Provider Notes (Signed)
[facility]EMERGENCY DEPT Provider Note   CSN: [id number] Arrival date & time: 10/09/19  0544     History Chief Complaint  Patient presents with  . Groin Pain    ASIER DESROCHES is a 39 y.o. male.  39 year old male presents with nausea and vomiting after taking medications to treat likely prostatitis.  He notes some dysuria and some perineal discomfort.  Denies any penile or testicular discomfort.  No fever or chills.  Self medicated with multiple medications including ibuprofen and Azo and then had emesis with diffuse abdominal cramping.  His emesis was nonbilious and bloody.  Symptoms have been persistent and no antiemetics use        Past Medical History:  Diagnosis Date  . Chest pain syndrome   . Drug abuse, cocaine type (HCC)   . Inguinal hernia   . Palpitations   . PUD (peptic ulcer disease)     There are no problems to display for this patient.   History reviewed. No pertinent surgical history.     Family History  Family history unknown: Yes    Social History   Tobacco Use  . Smoking status: Current Every Day Smoker  . Smokeless tobacco: Never Used  Substance Use Topics  . Alcohol use: No    Comment: In recovery, sober 3 years  . Drug use: No    Comment: In recovery, sober 3 years    Home Medications Prior to Admission medications   Medication Sig Start Date End Date Taking? Authorizing Provider  celecoxib (CELEBREX) 200 MG capsule Take 1 capsule (200 mg total) by mouth 2 (two) times daily. 10/08/19   Wallis Bamberg, PA-C  sulfamethoxazole-trimethoprim (BACTRIM DS) 800-160 MG tablet Take 1 tablet by mouth 2 (two) times daily. 10/08/19   Wallis Bamberg, PA-C  albuterol (PROVENTIL HFA;VENTOLIN HFA) 108 (90 Base) MCG/ACT inhaler Inhale 2 puffs into the lungs every 4 (four) hours as needed for wheezing or shortness of breath (cough, shortness of breath or wheezing.). 01/09/18 10/08/19  Elvina Sidle, MD    Allergies    Chantix  [varenicline]  Review of Systems   Review of Systems  All other systems reviewed and are negative.   Physical Exam Updated Vital Signs BP 136/74 (BP Location: Left Arm)   Pulse 71   Temp 97.7 F (36.5 C) (Oral)   Resp 16   Ht 1.753 m (5\' 9" )   Wt 68 kg   SpO2 97%   BMI 22.15 kg/m   Physical Exam Vitals and nursing note reviewed.  Constitutional:      General: He is not in acute distress.    Appearance: Normal appearance. He is well-developed. He is not toxic-appearing.  HENT:     Head: Normocephalic and atraumatic.  Eyes:     General: Lids are normal.     Conjunctiva/sclera: Conjunctivae normal.     Pupils: Pupils are equal, round, and reactive to light.  Neck:     Thyroid: No thyroid mass.     Trachea: No tracheal deviation.  Cardiovascular:     Rate and Rhythm: Normal rate and regular rhythm.     Heart sounds: Normal heart sounds. No murmur heard.  No gallop.   Pulmonary:     Effort: Pulmonary effort is normal. No respiratory distress.     Breath sounds: Normal breath sounds. No stridor. No decreased breath sounds, wheezing, rhonchi or rales.  Abdominal:     General: Bowel sounds are normal. There is no distension.  Palpations: Abdomen is soft.     Tenderness: There is no abdominal tenderness. There is no rebound.  Musculoskeletal:        General: No tenderness. Normal range of motion.     Cervical back: Normal range of motion and neck supple.  Skin:    General: Skin is warm and dry.     Findings: No abrasion or rash.  Neurological:     Mental Status: He is alert and oriented to person, place, and time.     GCS: GCS eye subscore is 4. GCS verbal subscore is 5. GCS motor subscore is 6.     Cranial Nerves: No cranial nerve deficit.     Sensory: No sensory deficit.  Psychiatric:        Speech: Speech normal.        Behavior: Behavior normal.     ED Results / Procedures / Treatments   Labs (all labs ordered are listed, but only abnormal results are  displayed) Labs Reviewed  CBC - Abnormal; Notable for the following components:      Result Value   WBC 14.4 (*)    All other components within normal limits  LIPASE, BLOOD  COMPREHENSIVE METABOLIC PANEL  URINALYSIS, ROUTINE W REFLEX MICROSCOPIC    EKG None  Radiology No results found.  Procedures Procedures (including critical care time)  Medications Ordered in ED Medications  sodium chloride 0.9 % bolus 2,000 mL (has no administration in time range)  0.9 %  sodium chloride infusion (has no administration in time range)  metoCLOPramide (REGLAN) injection 5 mg (has no administration in time range)  morphine 4 MG/ML injection 4 mg (has no administration in time range)  pantoprazole (PROTONIX) injection 40 mg (has no administration in time range)    ED Course  I have reviewed the triage vital signs and the nursing notes.  Pertinent labs & imaging results that were available during my care of the patient were reviewed by me and considered in my medical decision making (see chart for details).    MDM Rules/Calculators/A&P                         Patient given IV fluids to medicate here for emesis and feels better.  CT scan shows left-sided hydronephrosis with 3 mm distal left stone.  Urinalysis negative for infection.  Will discharge home with referral to urology Final Clinical Impression(s) / ED Diagnoses Final diagnoses:  None    Rx / DC Orders ED Discharge Orders    None       Lorre Nick, MD 10/09/19 1253

## 2019-10-09 NOTE — ED Triage Notes (Signed)
Pt reports lower abdominal and groin pain x1 week. He was seen and diagnosed with a UTI yesterday at Urgent Care. He states that he feels like his prostate is swelling. Ambulatory. Reports urinary hesitancy. Also reports nausea.

## 2019-10-09 NOTE — ED Notes (Signed)
Pt has more questions for MD. Made Dr Freida Busman aware, will be going to speak with patient.

## 2019-10-09 NOTE — ED Notes (Signed)
Pt getting dressed.

## 2019-10-09 NOTE — ED Notes (Signed)
Called lab to follow up on status of CMP and Lipase. Was told had to be sent to [facility] to be ran. Was received there at 0857, so is still in process.

## 2019-10-19 ENCOUNTER — Other Ambulatory Visit: Payer: Self-pay

## 2019-10-19 ENCOUNTER — Encounter: Payer: Self-pay | Admitting: Psychiatry

## 2019-10-19 ENCOUNTER — Ambulatory Visit (INDEPENDENT_AMBULATORY_CARE_PROVIDER_SITE_OTHER): Payer: 59 | Admitting: Psychiatry

## 2019-10-19 DIAGNOSIS — F4323 Adjustment disorder with mixed anxiety and depressed mood: Secondary | ICD-10-CM

## 2019-10-19 NOTE — Progress Notes (Addendum)
[facility] Counselor/Therapist Progress Note  Patient ID: Colin Davidson, MRN: [id number],    Date: 10/19/2019  Time Spent: 50 minutes   Treatment Type: Individual Therapy  Reported Symptoms: irritation, anxiety  Mental Status Exam:  Appearance:   Casual     Behavior:  Appropriate  Motor:  Normal  Speech/Language:   Clear and Coherent  Affect:  Appropriate  Mood:  anxious and irritable  Thought process:  normal  Thought content:    WNL  Sensory/Perceptual disturbances:    WNL  Orientation:  oriented to person, place, time/date and situation  Attention:  Good  Concentration:  Good  Memory:  WNL  Fund of knowledge:   Good  Insight:    Good  Judgment:   Good  Impulse Control:  Good   Risk Assessment: Danger to Self:  No Self-injurious Behavior: No Danger to Others: No Duty to Warn:no Physical Aggression / Violence:No  Access to Firearms a concern: No  Gang Involvement:No   Subjective: The client states that 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  He realizes that he is switching careers but it will result in a $20,000 increase in his yearly income.  The client currently is having a hard time covering his expenses now that he is married with a baby on the way.  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  He will also need money to cover his bills in the interim. The client states that things are not good at home with his wife.  She was unvaccinated and the client finally made her get vaccinated.  Within the last week a good friend of his who was unvaccinated died from COVID-19 with no underlying health issues.  This has been a conflict between the client and his wife.  He has concerns that he articulates, "but she will not listen."  He feels overburdened.  "My emotional capacity is decreasing."  She recently wanted to go to see a special show at a local bar called [employer].  She was  invited by her sponsor and AA.  The client felt it was not a good idea for her to be in a bar since she is in recovery.  He also did not think that being around a lot of people who [name] be unvaccinated and not wearing masks very wise.  He states that they were at odds and are currently not talking to each other. Today I used eye-movement with the client focusing on his anger that he feels with his wife.  His negative cognition is, "she will not listen."  He feels irritation and anxiety in his chest.  His subjective units of distress is an 8.  As the client processed he found himself thinking that he will have to be single dad.  He does not think she will have the capacity to care for their child.  She is currently not working which is put an extra burden on him.  "We are just out of money."  As the client continued to process we discussed that not communicating is not helpful.  He agreed but he just does not want to talk to her now.  I suggested the client communicate via a notebook where he writes to her and then she writes back.  He thought that this might work.  I also encouraged the client to start the antidepressant that he had been  prescribed for Wellbutrin.  He is uncertain if he wants to do that or not.  I also pointed out that it is in his best interest to work on the relationship especially since now there is a third person involved.  He agreed.  Interventions: Assertiveness/Communication, Motivational Interviewing, Solution-Oriented/Positive Psychology, [name] and Insight-Oriented  Diagnosis:   ICD-10-CM   1. Adjustment disorder with mixed anxiety and depressed mood  F43.23     Plan: Use a journal to communicate with wife, mood independent behavior, self-care, positive self talk, start Wellbutrin, evaluate new job and possible loan.  [name], [name]

## 2019-11-02 ENCOUNTER — Ambulatory Visit: Payer: 59 | Admitting: Psychiatry

## 2019-11-05 ENCOUNTER — Emergency Department (HOSPITAL_COMMUNITY): Payer: 59

## 2019-11-05 ENCOUNTER — Other Ambulatory Visit: Payer: Self-pay

## 2019-11-05 ENCOUNTER — Emergency Department (HOSPITAL_COMMUNITY)
Admission: EM | Admit: 2019-11-05 | Discharge: 2019-11-05 | Disposition: A | Discharge status: Home / Self Care | Payer: 59 | Attending: Emergency Medicine | Admitting: Emergency Medicine

## 2019-11-05 ENCOUNTER — Encounter (HOSPITAL_COMMUNITY): Payer: Self-pay | Admitting: Emergency Medicine

## 2019-11-05 DIAGNOSIS — R0789 Other chest pain: Secondary | ICD-10-CM | POA: Diagnosis not present

## 2019-11-05 DIAGNOSIS — F17210 Nicotine dependence, cigarettes, uncomplicated: Secondary | ICD-10-CM | POA: Insufficient documentation

## 2019-11-05 LAB — CBC
HCT: 47.4 % (ref 39.0–52.0)
Hemoglobin: 15.7 g/dL (ref 13.0–17.0)
MCH: 31.3 pg (ref 26.0–34.0)
MCHC: 33.1 g/dL (ref 30.0–36.0)
MCV: 94.4 fL (ref 80.0–100.0)
Platelets: 267 10*3/uL (ref 150–400)
RBC: 5.02 MIL/uL (ref 4.22–5.81)
RDW: 12.6 % (ref 11.5–15.5)
WBC: 9.8 10*3/uL (ref 4.0–10.5)
nRBC: 0 % (ref 0.0–0.2)

## 2019-11-05 LAB — BASIC METABOLIC PANEL
Anion gap: 9 (ref 5–15)
BUN: 12 mg/dL (ref 6–20)
CO2: 24 mmol/L (ref 22–32)
Calcium: 9.1 mg/dL (ref 8.9–10.3)
Chloride: 105 mmol/L (ref 98–111)
Creatinine, Ser: 0.84 mg/dL (ref 0.61–1.24)
GFR calc Af Amer: >60 mL/min (ref >60)
GFR calc non Af Amer: >60 mL/min (ref >60)
Glucose, Bld: 94 mg/dL (ref 70–99)
Potassium: 4.6 mmol/L (ref 3.5–5.1)
Sodium: 138 mmol/L (ref 135–145)

## 2019-11-05 LAB — TROPONIN I (HIGH SENSITIVITY)
Troponin I (High Sensitivity): 2 ng/L (ref <18)
Troponin I (High Sensitivity): 3 ng/L (ref <18)

## 2019-11-05 NOTE — ED Triage Notes (Addendum)
C/o constant L sided chest pain, sob, L arm numbness, and back pain since last night.  Denies nausea, vomiting, and dizziness.  No neuro deficits noted.

## 2019-11-05 NOTE — ED Provider Notes (Signed)
[facility] DEPARTMENT Provider Note  CSN: [id number] Arrival date & time: 11/05/19 1633    History Chief Complaint  Patient presents with  . Chest Pain    HPI  Colin Davidson is a 39 y.o. male with no significant PMH reports he has had chest pain since around 8pm last night described as moderate pressure in mid chest with L arm numbness and back pain. Not associated with SOB, N/V or diaphoresis. He reports a history of cocaine abuse, has been sober for several years, but had episodes of chest pain about 3 years ago that prompted him to see a cardiologist. He reports his workup there showed what sounds like a mildly decreased EF, but never had stents or MI. He admits to smoking tobacco, but denies HTN, DM, HLD or significant family history of CAD. He took ASA yesterday and today. Reports discomfort has improved while awaiting an ED exam room.    Past Medical History:  Diagnosis Date  . Chest pain syndrome   . Drug abuse, cocaine type (HCC)   . Inguinal hernia   . Palpitations   . PUD (peptic ulcer disease)     History reviewed. No pertinent surgical history.  Family History  Family history unknown: Yes    Social History   Tobacco Use  . Smoking status: Current Every Day Smoker  . Smokeless tobacco: Never Used  Substance Use Topics  . Alcohol use: No    Comment: In recovery, sober 3 years  . Drug use: No    Comment: In recovery, sober 3 years     Home Medications Prior to Admission medications   Medication Sig Start Date End Date Taking? Authorizing Provider  amphetamine-dextroamphetamine (ADDERALL) 30 MG tablet Take 30 mg by mouth in the morning and at bedtime. 10/03/19  Yes [provider]  omeprazole (PRILOSEC) 40 MG capsule Take 40 mg by mouth daily.   Yes [provider]  SUMAtriptan (IMITREX) 100 MG tablet Take 100 mg by mouth every 2 (two) hours as needed for migraine or headache.  09/28/19  Yes [provider]   sulfamethoxazole-trimethoprim (BACTRIM DS) 800-160 MG tablet Take 1 tablet by mouth 2 (two) times daily. Patient not taking: Reported on 11/05/2019 10/08/19   Wallis Bamberg, PA-C  tamsulosin (FLOMAX) 0.4 MG CAPS capsule Take 1 capsule (0.4 mg total) by mouth daily. Patient not taking: Reported on 11/05/2019 10/09/19   Lorre Nick, MD  albuterol (PROVENTIL HFA;VENTOLIN HFA) 108 (90 Base) MCG/ACT inhaler Inhale 2 puffs into the lungs every 4 (four) hours as needed for wheezing or shortness of breath (cough, shortness of breath or wheezing.). 01/09/18 10/08/19  Elvina Sidle, MD     Allergies    Chantix [varenicline]   Review of Systems   Review of Systems A comprehensive review of systems was completed and negative except as noted in HPI.    Physical Exam BP 118/70 (BP Location: Left Arm)   Pulse 62   Temp 98.7 F (37.1 C) (Oral)   Resp 15   Ht 5\' 10"  (1.778 m)   Wt 68 kg   SpO2 100%   BMI 21.52 kg/m   Physical Exam Vitals and nursing note reviewed.  Constitutional:      Appearance: Normal appearance.  HENT:     Head: Normocephalic and atraumatic.     Nose: Nose normal.     Mouth/Throat:     Mouth: Mucous membranes are moist.  Eyes:     Extraocular Movements: Extraocular movements  intact.     Conjunctiva/sclera: Conjunctivae normal.  Cardiovascular:     Rate and Rhythm: Normal rate.  Pulmonary:     Effort: Pulmonary effort is normal.     Breath sounds: Normal breath sounds.  Abdominal:     General: Abdomen is flat.     Palpations: Abdomen is soft.     Tenderness: There is no abdominal tenderness.  Musculoskeletal:        General: No swelling. Normal range of motion.     Cervical back: Neck supple.  Skin:    General: Skin is warm and dry.  Neurological:     General: No focal deficit present.     Mental Status: He is alert.  Psychiatric:        Mood and Affect: Mood normal.      ED Results / Procedures / Treatments   Labs (all labs ordered are listed, but  only abnormal results are displayed) Labs Reviewed  BASIC METABOLIC PANEL  CBC  TROPONIN I (HIGH SENSITIVITY)  TROPONIN I (HIGH SENSITIVITY)    EKG EKG Interpretation  Date/Time:  Sunday November 05 2019 17:18:24 EDT Ventricular Rate:  63 PR Interval:  158 QRS Duration: 90 QT Interval:  384 QTC Calculation: 392 R Axis:   96 Text Interpretation: Normal sinus rhythm Rightward axis Nonspecific ST abnormality Abnormal ECG Confirmed by Tilden Fossa [phone]) on 11/05/2019 7:30:27 PM   Radiology DG Chest 2 View  Result Date: 11/05/2019 CLINICAL DATA:  Chest pain on the left EXAM: CHEST - 2 VIEW COMPARISON:  09/06/2018 FINDINGS: The heart size and mediastinal contours are within normal limits. Both lungs are clear. The visualized skeletal structures are unremarkable. IMPRESSION: No active cardiopulmonary disease. Electronically Signed   By: Alcide Clever M.D.   On: 11/05/2019 17:55    Procedures Procedures  Medications Ordered in the ED Medications - No data to display   MDM Rules/Calculators/A&P MDM Patient with atypical chest pain, no significant ACS risk factors. Initial labs are normal. Awaiting second Trop.  ED Course  I have reviewed the triage vital signs and the nursing notes.  Pertinent labs & imaging results that were available during my care of the patient were reviewed by me and considered in my medical decision making (see chart for details).  Clinical Course as of Nov 04 2228  Wynelle Link Nov 05, 2019  2140 Second Trop is neg. Heart Pathway score is 2. Low risk and not in need of admission. Recommend he follow up with Cardiology. RTED for any other concerns.    [CS]    Clinical Course User Index [CS] Pollyann Savoy, MD    Final Clinical Impression(s) / ED Diagnoses Final diagnoses:  Atypical chest pain    Rx / DC Orders ED Discharge Orders    None       Pollyann Savoy, MD 11/05/19 2230

## 2019-11-16 ENCOUNTER — Ambulatory Visit: Payer: 59 | Admitting: Psychiatry

## 2019-11-30 ENCOUNTER — Ambulatory Visit: Payer: 59 | Admitting: Psychiatry

## 2019-11-30 ENCOUNTER — Ambulatory Visit: Payer: 59 | Admitting: Cardiology

## 2019-12-11 ENCOUNTER — Ambulatory Visit: Payer: 59 | Admitting: Adult Health

## 2019-12-18 ENCOUNTER — Encounter: Payer: Self-pay | Admitting: Cardiology

## 2020-10-31 ENCOUNTER — Other Ambulatory Visit (HOSPITAL_COMMUNITY): Payer: Self-pay

## 2020-10-31 MED ORDER — AMPHETAMINE-DEXTROAMPHETAMINE 30 MG PO TABS
ORAL_TABLET | ORAL | 0 refills | Status: DC
  Filled 2020-10-31: qty 60, 30d supply, fill #0

## 2020-10-31 MED ORDER — AMPHETAMINE-DEXTROAMPHETAMINE 30 MG PO TABS
ORAL_TABLET | ORAL | 0 refills | Status: DC
  Filled 2021-03-01: qty 60, 30d supply, fill #0

## 2020-11-01 ENCOUNTER — Other Ambulatory Visit (HOSPITAL_COMMUNITY): Payer: Self-pay

## 2020-11-05 ENCOUNTER — Other Ambulatory Visit (HOSPITAL_COMMUNITY): Payer: Self-pay

## 2020-11-05 MED ORDER — AMPHETAMINE-DEXTROAMPHETAMINE 30 MG PO TABS
ORAL_TABLET | ORAL | 0 refills | Status: DC
  Filled 2020-11-05 – 2020-11-30 (×2): qty 60, 30d supply, fill #0

## 2020-11-05 MED ORDER — AMPHETAMINE-DEXTROAMPHETAMINE 30 MG PO TABS
ORAL_TABLET | ORAL | 0 refills | Status: DC
  Filled 2020-12-30 – 2020-12-31 (×4): qty 60, 30d supply, fill #0

## 2020-11-05 MED ORDER — AMPHETAMINE-DEXTROAMPHETAMINE 30 MG PO TABS
ORAL_TABLET | ORAL | 0 refills | Status: DC
  Filled 2021-01-30: qty 60, 30d supply, fill #0

## 2020-11-12 ENCOUNTER — Other Ambulatory Visit (HOSPITAL_COMMUNITY): Payer: Self-pay

## 2020-11-12 MED ORDER — ALPRAZOLAM 0.25 MG PO TABS
ORAL_TABLET | ORAL | 0 refills | Status: DC
  Filled 2020-11-12: qty 30, 20d supply, fill #0

## 2020-11-30 ENCOUNTER — Other Ambulatory Visit (HOSPITAL_COMMUNITY): Payer: Self-pay

## 2020-12-28 ENCOUNTER — Other Ambulatory Visit (HOSPITAL_COMMUNITY): Payer: Self-pay

## 2020-12-30 ENCOUNTER — Other Ambulatory Visit (HOSPITAL_COMMUNITY): Payer: Self-pay

## 2020-12-31 ENCOUNTER — Other Ambulatory Visit (HOSPITAL_COMMUNITY): Payer: Self-pay

## 2021-01-27 ENCOUNTER — Other Ambulatory Visit (HOSPITAL_COMMUNITY): Payer: Self-pay

## 2021-01-30 ENCOUNTER — Other Ambulatory Visit (HOSPITAL_COMMUNITY): Payer: Self-pay

## 2021-02-13 DIAGNOSIS — H00011 Hordeolum externum right upper eyelid: Secondary | ICD-10-CM | POA: Diagnosis not present

## 2021-03-01 ENCOUNTER — Other Ambulatory Visit (HOSPITAL_COMMUNITY): Payer: Self-pay

## 2021-03-13 DIAGNOSIS — Z113 Encounter for screening for infections with a predominantly sexual mode of transmission: Secondary | ICD-10-CM | POA: Diagnosis not present

## 2021-03-13 DIAGNOSIS — Z7251 High risk heterosexual behavior: Secondary | ICD-10-CM | POA: Diagnosis not present

## 2021-03-13 DIAGNOSIS — R300 Dysuria: Secondary | ICD-10-CM | POA: Diagnosis not present

## 2021-03-14 DIAGNOSIS — R300 Dysuria: Secondary | ICD-10-CM | POA: Diagnosis not present

## 2021-03-21 DIAGNOSIS — Z113 Encounter for screening for infections with a predominantly sexual mode of transmission: Secondary | ICD-10-CM | POA: Diagnosis not present

## 2021-04-01 ENCOUNTER — Other Ambulatory Visit (HOSPITAL_COMMUNITY): Payer: Self-pay

## 2021-04-01 MED ORDER — AMPHETAMINE-DEXTROAMPHETAMINE 30 MG PO TABS
ORAL_TABLET | ORAL | 0 refills | Status: DC
  Filled 2021-04-01: qty 60, 30d supply, fill #0

## 2021-04-04 ENCOUNTER — Other Ambulatory Visit (HOSPITAL_COMMUNITY): Payer: Self-pay

## 2021-04-29 ENCOUNTER — Other Ambulatory Visit (HOSPITAL_COMMUNITY): Payer: Self-pay

## 2021-04-29 MED ORDER — AMPHETAMINE-DEXTROAMPHETAMINE 30 MG PO TABS
ORAL_TABLET | ORAL | 0 refills | Status: DC
  Filled 2021-04-29: qty 14, 7d supply, fill #0

## 2021-04-30 ENCOUNTER — Other Ambulatory Visit (HOSPITAL_COMMUNITY): Payer: Self-pay

## 2021-05-05 ENCOUNTER — Other Ambulatory Visit (HOSPITAL_COMMUNITY): Payer: Self-pay

## 2021-05-05 DIAGNOSIS — F909 Attention-deficit hyperactivity disorder, unspecified type: Secondary | ICD-10-CM | POA: Diagnosis not present

## 2021-05-05 MED ORDER — AMPHETAMINE-DEXTROAMPHETAMINE 30 MG PO TABS
30.0000 mg | ORAL_TABLET | Freq: Two times a day (BID) | ORAL | 0 refills | Status: DC
  Filled 2021-05-09: qty 60, 30d supply, fill #0

## 2021-05-05 MED ORDER — AMPHETAMINE-DEXTROAMPHETAMINE 30 MG PO TABS
30.0000 mg | ORAL_TABLET | Freq: Two times a day (BID) | ORAL | 0 refills | Status: DC
  Filled 2021-07-08: qty 60, 30d supply, fill #0

## 2021-05-05 MED ORDER — AMPHETAMINE-DEXTROAMPHETAMINE 30 MG PO TABS
30.0000 mg | ORAL_TABLET | Freq: Two times a day (BID) | ORAL | 0 refills | Status: DC
  Filled 2021-06-09: qty 60, 30d supply, fill #0

## 2021-05-09 ENCOUNTER — Other Ambulatory Visit (HOSPITAL_COMMUNITY): Payer: Self-pay

## 2021-05-27 ENCOUNTER — Other Ambulatory Visit (HOSPITAL_COMMUNITY): Payer: Self-pay

## 2021-06-09 ENCOUNTER — Other Ambulatory Visit (HOSPITAL_COMMUNITY): Payer: Self-pay

## 2021-07-08 ENCOUNTER — Other Ambulatory Visit (HOSPITAL_COMMUNITY): Payer: Self-pay

## 2021-08-01 ENCOUNTER — Other Ambulatory Visit (HOSPITAL_COMMUNITY): Payer: Self-pay

## 2021-08-04 ENCOUNTER — Other Ambulatory Visit (HOSPITAL_COMMUNITY): Payer: Self-pay

## 2021-08-04 MED ORDER — AMPHETAMINE-DEXTROAMPHETAMINE 30 MG PO TABS
ORAL_TABLET | ORAL | 0 refills | Status: DC
  Filled 2021-08-04 – 2021-08-07 (×2): qty 60, 30d supply, fill #0

## 2021-08-07 ENCOUNTER — Other Ambulatory Visit (HOSPITAL_COMMUNITY): Payer: Self-pay

## 2021-08-11 ENCOUNTER — Other Ambulatory Visit (HOSPITAL_COMMUNITY): Payer: Self-pay

## 2021-08-11 DIAGNOSIS — F17291 Nicotine dependence, other tobacco product, in remission: Secondary | ICD-10-CM | POA: Diagnosis not present

## 2021-08-11 DIAGNOSIS — F909 Attention-deficit hyperactivity disorder, unspecified type: Secondary | ICD-10-CM | POA: Diagnosis not present

## 2021-08-11 DIAGNOSIS — F411 Generalized anxiety disorder: Secondary | ICD-10-CM | POA: Diagnosis not present

## 2021-08-11 MED ORDER — AMPHETAMINE-DEXTROAMPHETAMINE 30 MG PO TABS
ORAL_TABLET | ORAL | 0 refills | Status: DC
  Filled 2021-09-08: qty 60, 30d supply, fill #0

## 2021-08-11 MED ORDER — AMPHETAMINE-DEXTROAMPHETAMINE 30 MG PO TABS
30.0000 mg | ORAL_TABLET | Freq: Two times a day (BID) | ORAL | 0 refills | Status: DC
  Filled 2021-11-08: qty 60, 30d supply, fill #0

## 2021-08-11 MED ORDER — AMPHETAMINE-DEXTROAMPHETAMINE 30 MG PO TABS
ORAL_TABLET | ORAL | 0 refills | Status: DC
  Filled 2021-10-08: qty 60, 30d supply, fill #0

## 2021-08-22 ENCOUNTER — Other Ambulatory Visit (HOSPITAL_COMMUNITY): Payer: Self-pay

## 2021-08-29 IMAGING — CT CT RENAL STONE PROTOCOL
2 of 4 series · 16 of 46 positions shown, 18 images · non-contrast
Comparison: 08/07/2009 abdominal radiographs. 09/21/2005 CT renal
stone.

CLINICAL DATA: Flank pain

EXAM:
CT ABDOMEN AND PELVIS WITHOUT CONTRAST
TECHNIQUE: Multidetector CT imaging of the abdomen and pelvis was performed
following the standard protocol without IV contrast.

[Series 2: axial st · axial · 0.65mm/px · z∈[-516,-96]mm · 13 of 96 slices shown, 15 images]
[im 6/96  soft-tissue]
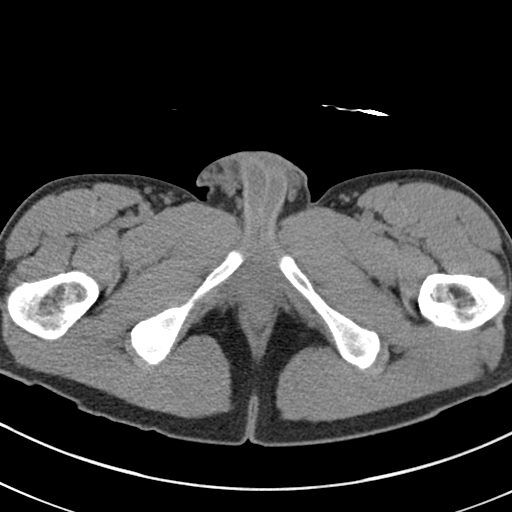
[im 6/96  bone]
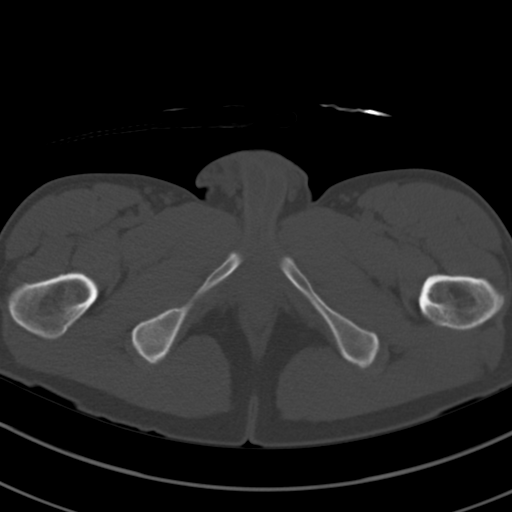
[im 12/96  soft-tissue]
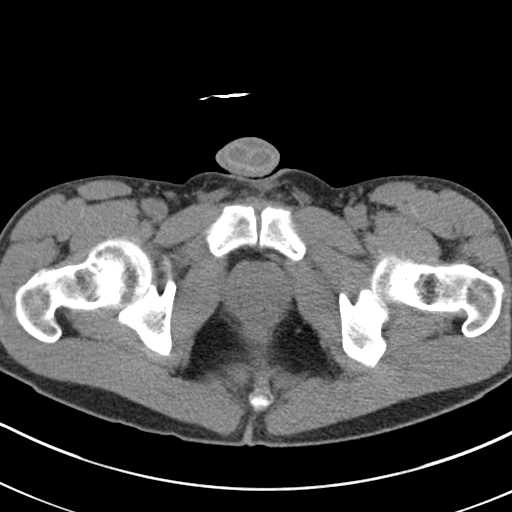
[im 18/96  soft-tissue]
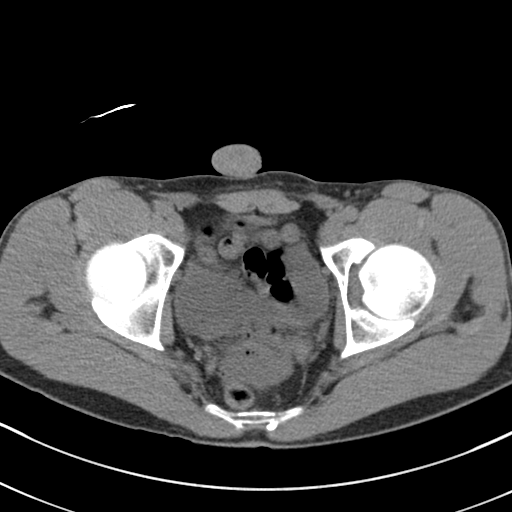
[im 30/96  soft-tissue]
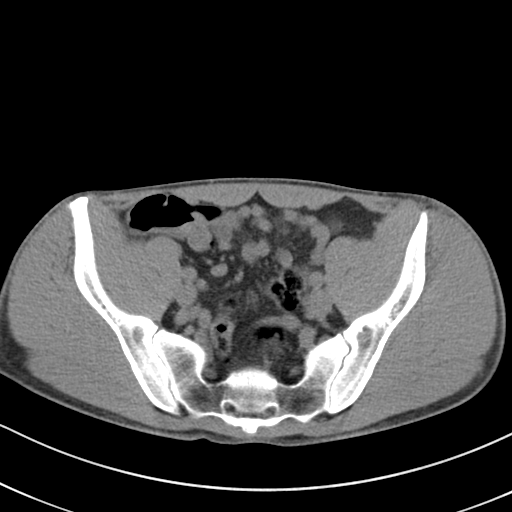
[im 36/96  soft-tissue]
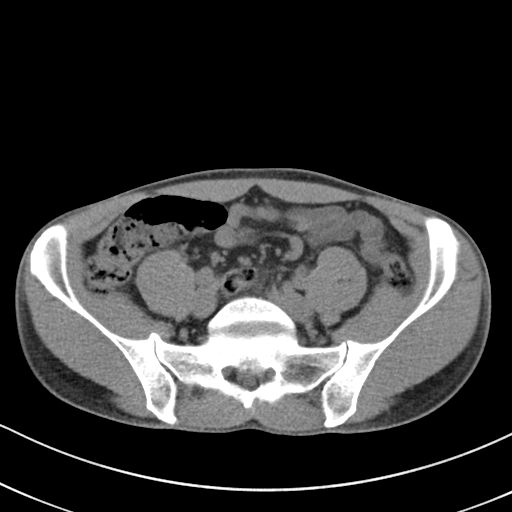
[im 42/96  soft-tissue]
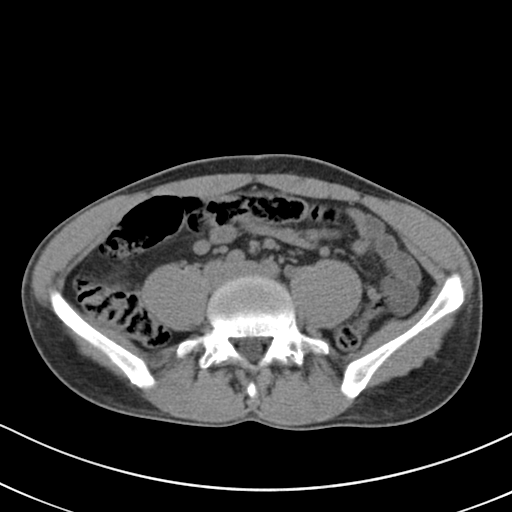
[im 48/96  soft-tissue]
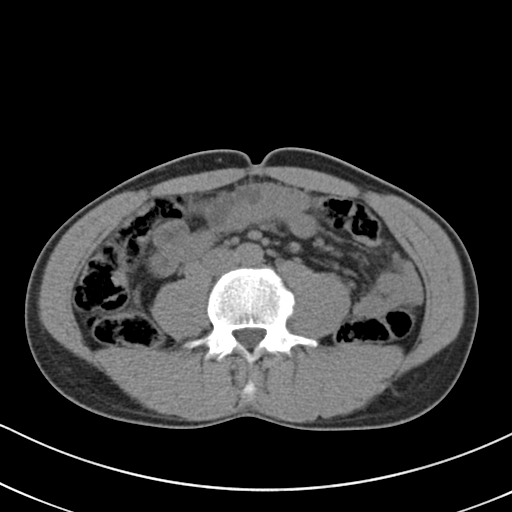
[im 54/96  soft-tissue]
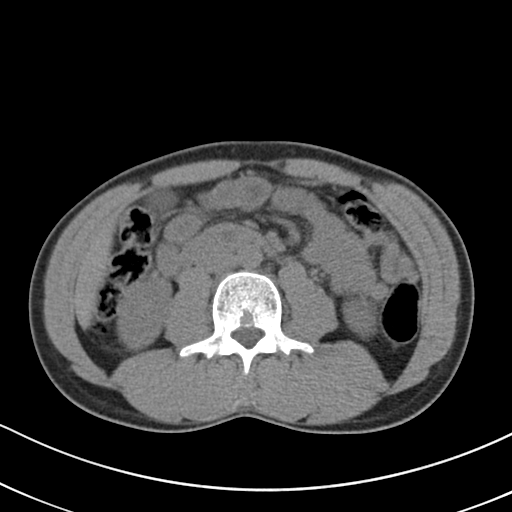
[im 60/96  soft-tissue]
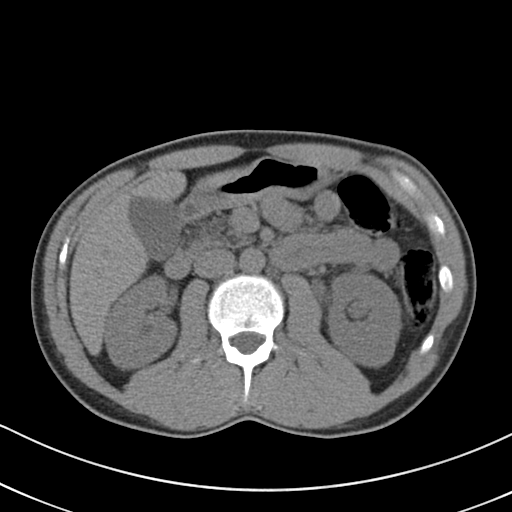
[im 60/96  bone]
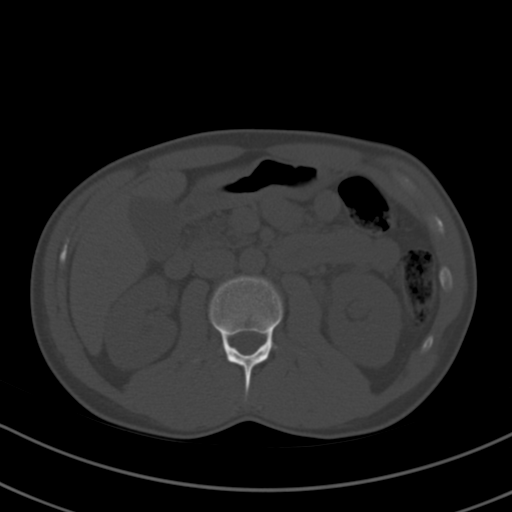
[im 66/96  soft-tissue]
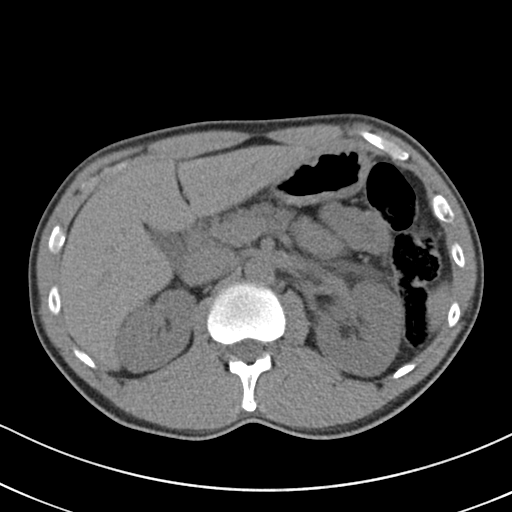
[im 78/96  soft-tissue]
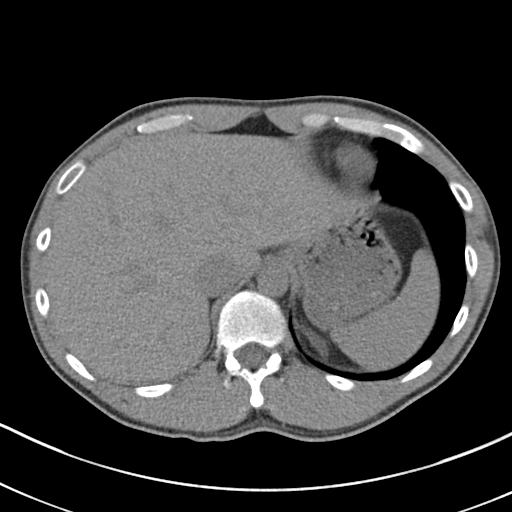
[im 84/96  soft-tissue]
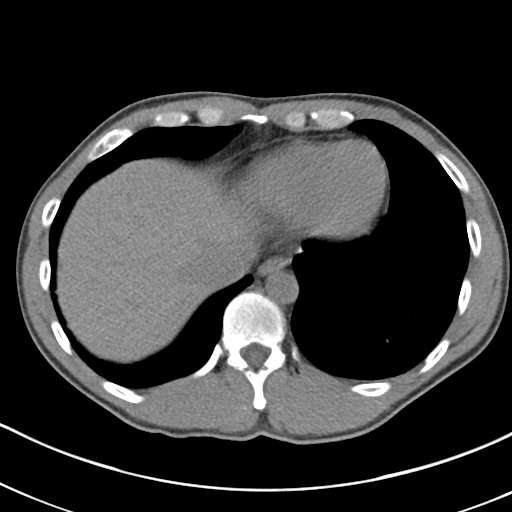
[im 90/96  soft-tissue]
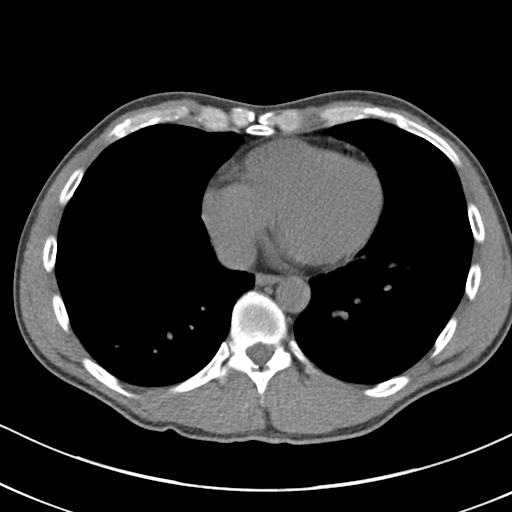

[Series 5: coronal · coronal · 0.72mm/px · 3 of 137 slices shown]
[im 46/137  soft-tissue]
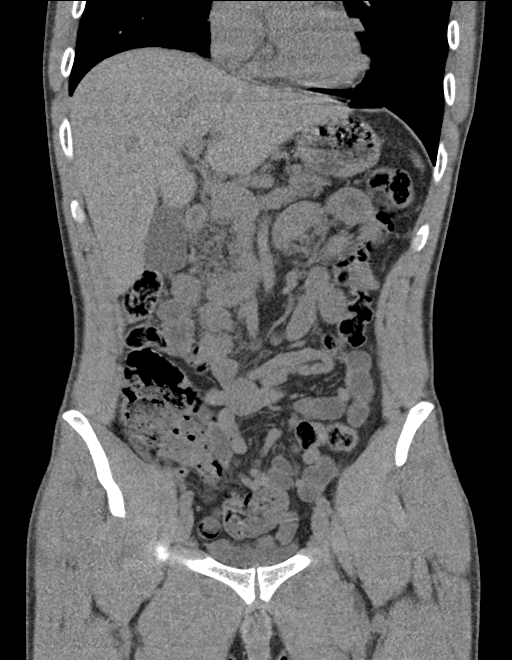
[im 61/137  soft-tissue]
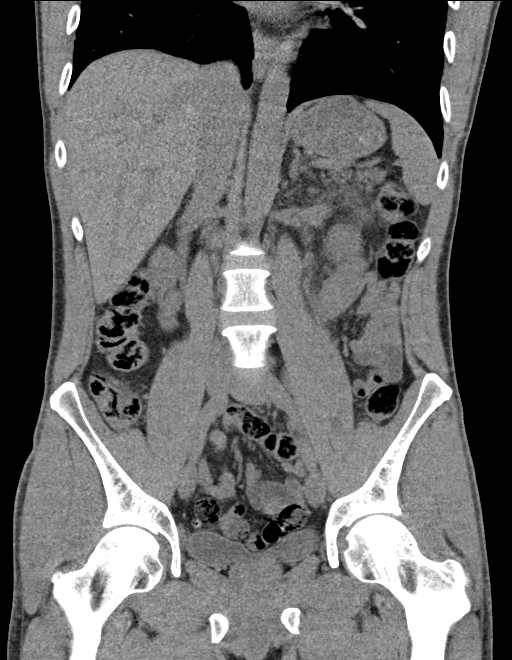
[im 76/137  soft-tissue]
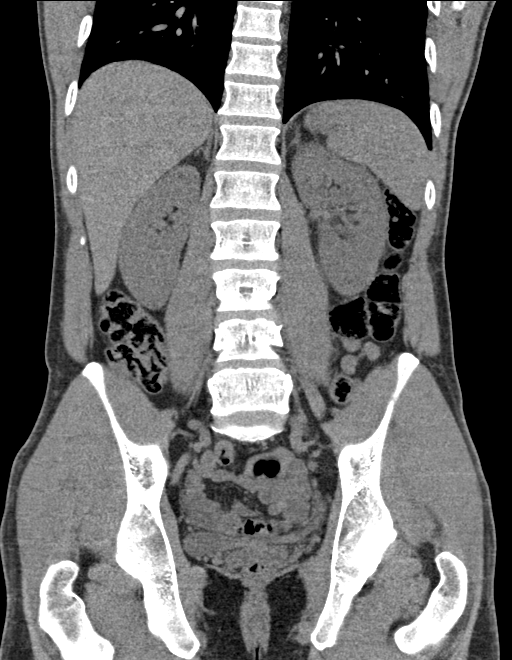

[16 of 46 positions shown; findings below may reference images not displayed]

FINDINGS: Please note that lack of intravenous contrast limits evaluation of
the viscera and vasculature.

Lower chest: Subsegmental bibasilar atelectasis.

Hepatobiliary: No focal hepatic lesion. No biliary dilatation.
Gallbladder is unremarkable.

Pancreas: No focal lesions or pancreatic ductal dilatation. No
surrounding inflammation.

Spleen: Unremarkable.

Adrenals/Urinary Tract: Adrenal glands are unremarkable. No focal
renal lesion. No calculi or hydronephrosis involving the right
kidney. 2-3 mm left inferior pole renal calculus ([DATE]). Mild left
hydroureteronephrosis. A 3 mm calcific density is seen within the
distal left ureter ([DATE]) with periureteral stranding. Bladder is
unremarkable.

Stomach/Bowel: Stomach is within normal limits. Appendix appears
normal. No evidence of obstruction. No bowel wall thickening or
inflammatory changes. No ascites.

Vascular/Lymphatic: Vasculature is within normal limits for
patient's age. No abdominopelvic adenopathy.

Reproductive: Unremarkable.

Other: External soft tissues are unremarkable.

Musculoskeletal: No acute or significant osseous findings.
IMPRESSION: 1. Mild left hydroureteronephrosis with 3 mm distal left ureteral
calculus.
2. 2-3 mm left inferior pole renal calculus.
3. No right nephrolithiasis.

## 2021-09-08 ENCOUNTER — Other Ambulatory Visit (HOSPITAL_COMMUNITY): Payer: Self-pay

## 2021-10-06 ENCOUNTER — Other Ambulatory Visit (HOSPITAL_COMMUNITY): Payer: Self-pay

## 2021-10-08 ENCOUNTER — Other Ambulatory Visit (HOSPITAL_COMMUNITY): Payer: Self-pay

## 2021-11-07 ENCOUNTER — Other Ambulatory Visit (HOSPITAL_COMMUNITY): Payer: Self-pay

## 2021-11-08 ENCOUNTER — Other Ambulatory Visit (HOSPITAL_COMMUNITY): Payer: Self-pay

## 2021-12-08 ENCOUNTER — Other Ambulatory Visit (HOSPITAL_COMMUNITY): Payer: Self-pay

## 2021-12-08 DIAGNOSIS — F411 Generalized anxiety disorder: Secondary | ICD-10-CM | POA: Diagnosis not present

## 2021-12-08 DIAGNOSIS — F909 Attention-deficit hyperactivity disorder, unspecified type: Secondary | ICD-10-CM | POA: Diagnosis not present

## 2021-12-08 DIAGNOSIS — Z23 Encounter for immunization: Secondary | ICD-10-CM | POA: Diagnosis not present

## 2021-12-08 DIAGNOSIS — F17210 Nicotine dependence, cigarettes, uncomplicated: Secondary | ICD-10-CM | POA: Diagnosis not present

## 2021-12-08 DIAGNOSIS — Z0000 Encounter for general adult medical examination without abnormal findings: Secondary | ICD-10-CM | POA: Diagnosis not present

## 2021-12-08 DIAGNOSIS — Z202 Contact with and (suspected) exposure to infections with a predominantly sexual mode of transmission: Secondary | ICD-10-CM | POA: Diagnosis not present

## 2021-12-08 DIAGNOSIS — Z8349 Family history of other endocrine, nutritional and metabolic diseases: Secondary | ICD-10-CM | POA: Diagnosis not present

## 2021-12-08 DIAGNOSIS — Z13220 Encounter for screening for lipoid disorders: Secondary | ICD-10-CM | POA: Diagnosis not present

## 2021-12-08 MED ORDER — AMPHETAMINE-DEXTROAMPHETAMINE 30 MG PO TABS
30.0000 mg | ORAL_TABLET | Freq: Two times a day (BID) | ORAL | 0 refills | Status: DC
  Filled 2021-12-08: qty 60, 30d supply, fill #0

## 2021-12-08 MED ORDER — AMPHETAMINE-DEXTROAMPHETAMINE 30 MG PO TABS
30.0000 mg | ORAL_TABLET | Freq: Two times a day (BID) | ORAL | 0 refills | Status: DC
  Filled 2022-02-06: qty 60, 30d supply, fill #0

## 2021-12-08 MED ORDER — AMPHETAMINE-DEXTROAMPHETAMINE 30 MG PO TABS
30.0000 mg | ORAL_TABLET | Freq: Two times a day (BID) | ORAL | 0 refills | Status: DC
  Filled 2022-01-07: qty 60, 30d supply, fill #0

## 2021-12-09 ENCOUNTER — Other Ambulatory Visit (HOSPITAL_COMMUNITY): Payer: Self-pay

## 2022-01-07 ENCOUNTER — Other Ambulatory Visit (HOSPITAL_COMMUNITY): Payer: Self-pay

## 2022-01-14 DIAGNOSIS — E781 Pure hyperglyceridemia: Secondary | ICD-10-CM | POA: Diagnosis not present

## 2022-01-14 DIAGNOSIS — Z202 Contact with and (suspected) exposure to infections with a predominantly sexual mode of transmission: Secondary | ICD-10-CM | POA: Diagnosis not present

## 2022-02-06 ENCOUNTER — Other Ambulatory Visit (HOSPITAL_COMMUNITY): Payer: Self-pay

## 2022-03-02 ENCOUNTER — Other Ambulatory Visit (HOSPITAL_COMMUNITY): Payer: Self-pay

## 2022-03-02 MED ORDER — AMPHETAMINE-DEXTROAMPHETAMINE 30 MG PO TABS
1.0000 | ORAL_TABLET | Freq: Two times a day (BID) | ORAL | 0 refills | Status: DC
  Filled 2022-03-06: qty 60, 30d supply, fill #0

## 2022-03-06 ENCOUNTER — Other Ambulatory Visit (HOSPITAL_COMMUNITY): Payer: Self-pay

## 2022-03-09 ENCOUNTER — Other Ambulatory Visit (HOSPITAL_COMMUNITY): Payer: Self-pay

## 2022-03-31 ENCOUNTER — Other Ambulatory Visit (HOSPITAL_BASED_OUTPATIENT_CLINIC_OR_DEPARTMENT_OTHER): Payer: Self-pay

## 2022-04-06 ENCOUNTER — Other Ambulatory Visit (HOSPITAL_COMMUNITY): Payer: Self-pay

## 2022-04-06 MED ORDER — AMPHETAMINE-DEXTROAMPHETAMINE 30 MG PO TABS
1.0000 | ORAL_TABLET | Freq: Two times a day (BID) | ORAL | 0 refills | Status: DC
  Filled 2022-04-06: qty 60, 30d supply, fill #0

## 2022-04-08 ENCOUNTER — Other Ambulatory Visit (HOSPITAL_COMMUNITY): Payer: Self-pay

## 2022-05-08 ENCOUNTER — Other Ambulatory Visit (HOSPITAL_COMMUNITY): Payer: Self-pay

## 2022-05-11 ENCOUNTER — Other Ambulatory Visit (HOSPITAL_COMMUNITY): Payer: Self-pay

## 2022-05-11 MED ORDER — AMPHETAMINE-DEXTROAMPHETAMINE 30 MG PO TABS
1.0000 | ORAL_TABLET | Freq: Two times a day (BID) | ORAL | 0 refills | Status: DC
  Filled 2022-05-11: qty 60, 30d supply, fill #0

## 2022-06-02 ENCOUNTER — Other Ambulatory Visit (HOSPITAL_COMMUNITY): Payer: Self-pay

## 2022-06-02 ENCOUNTER — Other Ambulatory Visit: Payer: Self-pay

## 2022-06-02 MED ORDER — OXYCODONE-ACETAMINOPHEN 5-325 MG PO TABS
1.0000 | ORAL_TABLET | ORAL | 0 refills | Status: DC | PRN
  Filled 2022-06-02: qty 6, 1d supply, fill #0

## 2022-06-02 MED ORDER — DIAZEPAM 5 MG PO TABS
10.0000 mg | ORAL_TABLET | ORAL | 0 refills | Status: DC
  Filled 2022-06-02: qty 2, 1d supply, fill #0

## 2022-06-02 MED ORDER — CEPHALEXIN 500 MG PO CAPS
ORAL_CAPSULE | ORAL | 0 refills | Status: DC
  Filled 2022-06-02: qty 2, 2d supply, fill #0

## 2022-06-02 MED ORDER — DIAZEPAM 5 MG PO TABS
10.0000 mg | ORAL_TABLET | Freq: Every day | ORAL | 0 refills | Status: DC
  Filled 2022-06-02: qty 2, 1d supply, fill #0

## 2022-06-02 MED ORDER — CEPHALEXIN 500 MG PO CAPS
500.0000 mg | ORAL_CAPSULE | Freq: Two times a day (BID) | ORAL | 0 refills | Status: DC
  Filled 2022-06-02: qty 2, 1d supply, fill #0

## 2022-06-04 ENCOUNTER — Other Ambulatory Visit (HOSPITAL_COMMUNITY): Payer: Self-pay

## 2022-06-04 MED ORDER — AMPHETAMINE-DEXTROAMPHETAMINE 30 MG PO TABS
1.0000 | ORAL_TABLET | Freq: Two times a day (BID) | ORAL | 0 refills | Status: DC
  Filled 2022-06-08: qty 60, 30d supply, fill #0

## 2022-06-05 ENCOUNTER — Other Ambulatory Visit (HOSPITAL_COMMUNITY): Payer: Self-pay

## 2022-06-08 ENCOUNTER — Other Ambulatory Visit: Payer: Self-pay

## 2022-06-08 ENCOUNTER — Other Ambulatory Visit (HOSPITAL_COMMUNITY): Payer: Self-pay

## 2022-07-07 ENCOUNTER — Other Ambulatory Visit (HOSPITAL_COMMUNITY): Payer: Self-pay

## 2022-07-07 MED ORDER — AMPHETAMINE-DEXTROAMPHETAMINE 30 MG PO TABS
1.0000 | ORAL_TABLET | Freq: Two times a day (BID) | ORAL | 0 refills | Status: DC
  Filled 2022-07-07: qty 60, 30d supply, fill #0

## 2022-07-08 ENCOUNTER — Other Ambulatory Visit (HOSPITAL_COMMUNITY): Payer: Self-pay

## 2022-07-08 MED ORDER — AMPHETAMINE-DEXTROAMPHETAMINE 30 MG PO TABS
1.0000 | ORAL_TABLET | Freq: Two times a day (BID) | ORAL | 0 refills | Status: DC
  Filled 2022-07-08 – 2022-08-11 (×2): qty 60, 30d supply, fill #0

## 2022-07-10 ENCOUNTER — Other Ambulatory Visit (HOSPITAL_COMMUNITY): Payer: Self-pay

## 2022-08-11 ENCOUNTER — Other Ambulatory Visit (HOSPITAL_COMMUNITY): Payer: Self-pay

## 2022-09-07 ENCOUNTER — Other Ambulatory Visit (HOSPITAL_COMMUNITY): Payer: Self-pay

## 2022-09-08 ENCOUNTER — Other Ambulatory Visit (HOSPITAL_COMMUNITY): Payer: Self-pay

## 2022-09-08 MED ORDER — AMPHETAMINE-DEXTROAMPHETAMINE 30 MG PO TABS
1.0000 | ORAL_TABLET | Freq: Two times a day (BID) | ORAL | 0 refills | Status: DC
  Filled 2022-09-08: qty 60, 30d supply, fill #0

## 2022-10-13 ENCOUNTER — Other Ambulatory Visit (HOSPITAL_COMMUNITY): Payer: Self-pay

## 2022-10-14 ENCOUNTER — Other Ambulatory Visit (HOSPITAL_COMMUNITY): Payer: Self-pay

## 2022-10-14 MED ORDER — AMPHETAMINE-DEXTROAMPHETAMINE 30 MG PO TABS
1.0000 | ORAL_TABLET | Freq: Two times a day (BID) | ORAL | 0 refills | Status: DC
  Filled 2022-10-14: qty 60, 30d supply, fill #0

## 2022-10-16 ENCOUNTER — Other Ambulatory Visit (HOSPITAL_COMMUNITY): Payer: Self-pay

## 2022-11-13 ENCOUNTER — Other Ambulatory Visit (HOSPITAL_COMMUNITY): Payer: Self-pay

## 2022-11-13 MED ORDER — AMPHETAMINE-DEXTROAMPHETAMINE 30 MG PO TABS
1.0000 | ORAL_TABLET | Freq: Two times a day (BID) | ORAL | 0 refills | Status: DC
  Filled 2022-11-13: qty 60, 30d supply, fill #0

## 2022-11-19 ENCOUNTER — Other Ambulatory Visit (HOSPITAL_COMMUNITY): Payer: Self-pay

## 2023-01-20 ENCOUNTER — Other Ambulatory Visit: Payer: Self-pay

## 2023-02-01 ENCOUNTER — Ambulatory Visit (HOSPITAL_COMMUNITY): Payer: Medicaid Other | Admitting: Psychiatry

## 2024-01-21 ENCOUNTER — Emergency Department (HOSPITAL_COMMUNITY): Payer: MEDICAID

## 2024-01-21 ENCOUNTER — Encounter (HOSPITAL_COMMUNITY): Payer: Self-pay

## 2024-01-21 ENCOUNTER — Other Ambulatory Visit: Payer: Self-pay

## 2024-01-21 ENCOUNTER — Emergency Department (HOSPITAL_COMMUNITY)
Admission: EM | Admit: 2024-01-21 | Discharge: 2024-01-22 | Disposition: A | Payer: MEDICAID | Attending: Emergency Medicine | Admitting: Emergency Medicine

## 2024-01-21 DIAGNOSIS — F1910 Other psychoactive substance abuse, uncomplicated: Secondary | ICD-10-CM | POA: Insufficient documentation

## 2024-01-21 DIAGNOSIS — R45851 Suicidal ideations: Secondary | ICD-10-CM | POA: Insufficient documentation

## 2024-01-21 DIAGNOSIS — M7989 Other specified soft tissue disorders: Secondary | ICD-10-CM

## 2024-01-21 DIAGNOSIS — F1420 Cocaine dependence, uncomplicated: Secondary | ICD-10-CM | POA: Insufficient documentation

## 2024-01-21 DIAGNOSIS — Z79899 Other long term (current) drug therapy: Secondary | ICD-10-CM | POA: Insufficient documentation

## 2024-01-21 DIAGNOSIS — F29 Unspecified psychosis not due to a substance or known physiological condition: Secondary | ICD-10-CM | POA: Insufficient documentation

## 2024-01-21 DIAGNOSIS — L03114 Cellulitis of left upper limb: Secondary | ICD-10-CM | POA: Insufficient documentation

## 2024-01-21 DIAGNOSIS — R21 Rash and other nonspecific skin eruption: Secondary | ICD-10-CM

## 2024-01-21 DIAGNOSIS — F32A Depression, unspecified: Secondary | ICD-10-CM | POA: Insufficient documentation

## 2024-01-21 LAB — CBC
HCT: 47.2 % (ref 39.0–52.0)
Hemoglobin: 15.8 g/dL (ref 13.0–17.0)
MCH: 31.2 pg (ref 26.0–34.0)
MCHC: 33.5 g/dL (ref 30.0–36.0)
MCV: 93.3 fL (ref 80.0–100.0)
Platelets: 231 K/uL (ref 150–400)
RBC: 5.06 MIL/uL (ref 4.22–5.81)
RDW: 13.2 % (ref 11.5–15.5)
WBC: 8 K/uL (ref 4.0–10.5)
nRBC: 0 % (ref 0.0–0.2)

## 2024-01-21 LAB — URINE DRUG SCREEN
Amphetamines: NEGATIVE
Barbiturates: NEGATIVE
Benzodiazepines: NEGATIVE
Cocaine: POSITIVE — AB
Fentanyl: NEGATIVE
Methadone Scn, Ur: NEGATIVE
Opiates: NEGATIVE
Tetrahydrocannabinol: NEGATIVE

## 2024-01-21 LAB — COMPREHENSIVE METABOLIC PANEL WITH GFR
ALT: 20 U/L (ref 0–44)
AST: 38 U/L (ref 15–41)
Albumin: 4.3 g/dL (ref 3.5–5.0)
Alkaline Phosphatase: 104 U/L (ref 38–126)
Anion gap: 11 (ref 5–15)
BUN: 11 mg/dL (ref 6–20)
CO2: 26 mmol/L (ref 22–32)
Calcium: 9.3 mg/dL (ref 8.9–10.3)
Chloride: 103 mmol/L (ref 98–111)
Creatinine, Ser: 0.78 mg/dL (ref 0.61–1.24)
GFR, Estimated: >60 mL/min (ref >60)
Glucose, Bld: 96 mg/dL (ref 70–99)
Potassium: 4 mmol/L (ref 3.5–5.1)
Sodium: 139 mmol/L (ref 135–145)
Total Bilirubin: 0.2 mg/dL (ref 0.0–1.2)
Total Protein: 7.5 g/dL (ref 6.5–8.1)

## 2024-01-21 LAB — ETHANOL: Alcohol, Ethyl (B): <15 mg/dL (ref <15)

## 2024-01-21 LAB — ACETAMINOPHEN LEVEL: Acetaminophen (Tylenol), Serum: <10 ug/mL — ABNORMAL LOW (ref 10–30)

## 2024-01-21 LAB — SALICYLATE LEVEL: Salicylate Lvl: <7 mg/dL — ABNORMAL LOW (ref 7.0–30.0)

## 2024-01-21 MED ORDER — SULFAMETHOXAZOLE-TRIMETHOPRIM 800-160 MG PO TABS: 1.0000 | ORAL_TABLET | Freq: Two times a day (BID) | ORAL | 0 refills | Status: DC

## 2024-01-21 MED ORDER — SULFAMETHOXAZOLE-TRIMETHOPRIM 800-160 MG PO TABS: 1.0000 | ORAL_TABLET | Freq: Two times a day (BID) | ORAL | Status: DC

## 2024-01-21 MED ORDER — CEFAZOLIN SODIUM-DEXTROSE 1-4 GM/50ML-% IV SOLN
1.0000 g | Freq: Once | INTRAVENOUS | Status: AC
  Administered 2024-01-21: 1 g via INTRAVENOUS
  Filled 2024-01-21: qty 50

## 2024-01-21 NOTE — ED Triage Notes (Addendum)
 Patient said he keeps overdosing on drugs (IV cocaine) and did this yesterday. He has not been eating/drinking for a few days. Thinks he has a hand infection from the IV drugs in his left hand. Swollen and red. Feels suicidal and thinks the drugs make him feel this way.

## 2024-01-21 NOTE — Discharge Instructions (Signed)
 Take antibiotic as directed for infection of left hand.  If you experience worsening redness/swelling, redness tracking up the arm, fevers or other concerns please return to emergency department.

## 2024-01-21 NOTE — ED Notes (Addendum)
 Pt moved to room 28 TCU , cart TTS #1 evaluation in progress.

## 2024-01-21 NOTE — Progress Notes (Signed)
 VASCULAR LAB    Left upper extremity venous duplex has been performed.  See CV proc for preliminary results.  Relayed results to Dr. Bari via secure chat  RACHEL PELLET, RVT 01/21/2024, 5:44 PM
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
## 2024-01-21 NOTE — ED Provider Notes (Signed)
 [place] EMERGENCY DEPARTMENT AT [facility] Provider Note   CSN: [id number] Arrival date & time: 01/21/24  1406     Patient presents with: Suicidal   Colin Davidson is a 43 y.o. male.   HPI   43 year old male presents emergency department with concern for substance abuse, thoughts of self-harm/suicide without a plan and infection of the left hand.  He admits that he has been injecting at multiple sites on the bilateral arms, specifically using IV cocaine.  His last use was yesterday.  He states that he wants to quit and get clean and is also worried about his declining mental health with thoughts of self-harm/suicide.  He has no active plan.  No previous attempts that he is revealing.  Denies any fever or systemic symptoms.  Prior to Admission medications  Medication Sig Start Date End Date Taking? Authorizing Provider  sulfamethoxazole -trimethoprim  (BACTRIM  DS) 800-160 MG tablet Take 1 tablet by mouth 2 (two) times daily for 7 days. 01/22/24 01/29/24 Yes Kion Huntsberry, Roxie HERO, DO  ALPRAZolam  (XANAX ) 0.25 MG tablet Take 1 tablet by mouth 2 times a day as needed for anxiety 11/12/20     amphetamine -dextroamphetamine  (ADDERALL) 30 MG tablet Take 30 mg by mouth in the morning and at bedtime. 10/03/19   [provider]  amphetamine -dextroamphetamine  (ADDERALL) 30 MG tablet Take 1/2 tablet by mouth four times a day 10/31/20     amphetamine -dextroamphetamine  (ADDERALL) 30 MG tablet Take 1/2 tablet by mouth 4 times a day (01/03/21) 01/03/21     amphetamine -dextroamphetamine  (ADDERALL) 30 MG tablet Take 1/2 tablet by mouth 4 times a day 11/04/20     amphetamine -dextroamphetamine  (ADDERALL) 30 MG tablet Take 1/2 tablet by mouth 4 times a day 12/04/20     amphetamine -dextroamphetamine  (ADDERALL) 30 MG tablet Take 1 tablet by mouth 2 times a day for 7 days 04/29/21     amphetamine -dextroamphetamine  (ADDERALL) 30 MG tablet Take 1 tablet by mouth 2 times daily.(fill 06/07/21) 06/07/21      amphetamine -dextroamphetamine  (ADDERALL) 30 MG tablet Take 1 tablet by mouth 2 times daily (fill 05/08/21) 05/08/21     amphetamine -dextroamphetamine  (ADDERALL) 30 MG tablet Take 1 tablet by mouth twice a day for 30 days 08/04/21     amphetamine -dextroamphetamine  (ADDERALL) 30 MG tablet Take 1 tablet by mouth twice a day 10/08/21     amphetamine -dextroamphetamine  (ADDERALL) 30 MG tablet Take 1 tablet by mouth twice a day 09/07/21     amphetamine -dextroamphetamine  (ADDERALL) 30 MG tablet Take 1 tablet by mouth 2 (two) times daily. 12/08/21     amphetamine -dextroamphetamine  (ADDERALL) 30 MG tablet Take 1 tablet by mouth 2 (two) times daily. 12/08/21     amphetamine -dextroamphetamine  (ADDERALL) 30 MG tablet Take 1 tablet by mouth 2 times daily. 07/07/22     amphetamine -dextroamphetamine  (ADDERALL) 30 MG tablet Take 1 tablet by mouth 2 (two) times daily. 11/13/22     cephALEXin  (KEFLEX ) 500 MG capsule Take 1 capsule (500 mg total) by mouth 2 (two) times daily (one in the morning and one in the evening on the day of your procedure) 06/02/22     diazepam  (VALIUM ) 5 MG tablet Take 2 tablets (10 mg total) by mouth daily one hour prior to procedure 06/02/22     omeprazole (PRILOSEC) 40 MG capsule Take 40 mg by mouth daily.    [provider]  oxyCODONE -acetaminophen  (PERCOCET) 5-325 MG tablet Take 1 tablet by mouth every 4 (four) hours as needed. 06/02/22     SUMAtriptan (IMITREX) 100 MG tablet  Take 100 mg by mouth every 2 (two) hours as needed for migraine or headache.  09/28/19   [provider]  tamsulosin  (FLOMAX ) 0.4 MG CAPS capsule Take 1 capsule (0.4 mg total) by mouth daily. Patient not taking: Reported on 11/05/2019 10/09/19   Dasie Faden, MD  albuterol  (PROVENTIL  HFA;VENTOLIN  HFA) 108 (90 Base) MCG/ACT inhaler Inhale 2 puffs into the lungs every 4 (four) hours as needed for wheezing or shortness of breath (cough, shortness of breath or wheezing.). 01/09/18 10/08/19  Mario Million, MD     Allergies: Chantix [varenicline]    Review of Systems  Constitutional:  Negative for fever.  Respiratory:  Negative for shortness of breath.   Cardiovascular:  Negative for chest pain.  Gastrointestinal:  Negative for abdominal pain, diarrhea and vomiting.  Skin:  Negative for rash.       Redness of the left hand  Neurological:  Negative for headaches.  Psychiatric/Behavioral:         + depression    Updated Vital Signs BP 118/73 (BP Location: Left Arm)   Pulse 63   Temp 97.8 F (36.6 C) (Oral)   Resp 18   Ht 5' 10 (1.778 m)   Wt 73 kg   SpO2 98%   BMI 23.10 kg/m   Physical Exam Vitals and nursing note reviewed.  Constitutional:      General: He is not in acute distress.    Appearance: Normal appearance.  HENT:     Head: Normocephalic.     Mouth/Throat:     Mouth: Mucous membranes are moist.  Cardiovascular:     Rate and Rhythm: Normal rate.  Pulmonary:     Effort: Pulmonary effort is normal. No respiratory distress.  Abdominal:     Palpations: Abdomen is soft.     Tenderness: There is no abdominal tenderness.  Musculoskeletal:     Comments: Multiple track marks and scarring on the bilateral forearms.  Dorsum of the left hand is slightly swollen, erythematous with some mild tracking past the wrist  Skin:    General: Skin is warm.  Neurological:     Mental Status: He is alert and oriented to person, place, and time. Mental status is at baseline.  Psychiatric:        Mood and Affect: Mood normal.     Comments: Tearful at times     (all labs ordered are listed, but only abnormal results are displayed) Labs Reviewed  URINE DRUG SCREEN - Abnormal; Notable for the following components:      Result Value   Cocaine POSITIVE (*)    All other components within normal limits  SALICYLATE LEVEL - Abnormal; Notable for the following components:   Salicylate Lvl <7.0 (*)    All other components within normal limits  ACETAMINOPHEN  LEVEL - Abnormal; Notable for  the following components:   Acetaminophen  (Tylenol ), Serum <10 (*)    All other components within normal limits  COMPREHENSIVE METABOLIC PANEL WITH GFR  ETHANOL  CBC    EKG: None  Radiology: UE VENOUS DUPLEX (7am - 7pm) Result Date: 01/21/2024 UPPER VENOUS STUDY  Patient Name:  Colin Davidson  Date of Exam:   01/21/2024 Medical Rec #: [id number]       Accession #:    [id number] Date of Birth: Jul 10, 1980        Patient Gender: M Patient Age:   32 years Exam Location:  [facility] Procedure:      VAS US  UPPER EXTREMITY VENOUS DUPLEX  Referring Phys: Jaimon Bugaj --------------------------------------------------------------------------------  Indications: Swelling and redness of left hand status post injection of IV drugs Comparison Study: No prior study on file Performing Technologist: Alberta Lis RVS  Examination Guidelines: A complete evaluation includes B-mode imaging, spectral Doppler, color Doppler, and power Doppler as needed of all accessible portions of each vessel. Bilateral testing is considered an integral part of a complete examination. Limited examinations for reoccurring indications may be performed as noted.  Right Findings: +----------+------------+---------+-----------+----------+-------+ RIGHT     CompressiblePhasicitySpontaneousPropertiesSummary +----------+------------+---------+-----------+----------+-------+ Subclavian               Yes       Yes                      +----------+------------+---------+-----------+----------+-------+  Left Findings: +----------+------------+---------+-----------+----------+-------+ LEFT      CompressiblePhasicitySpontaneousPropertiesSummary +----------+------------+---------+-----------+----------+-------+ IJV           Full       Yes       Yes                      +----------+------------+---------+-----------+----------+-------+ Subclavian    Full       Yes       Yes                       +----------+------------+---------+-----------+----------+-------+ Axillary      Full       Yes       Yes                      +----------+------------+---------+-----------+----------+-------+ Brachial                 Yes       Yes                      +----------+------------+---------+-----------+----------+-------+ Radial        Full                                          +----------+------------+---------+-----------+----------+-------+ Ulnar         Full                                          +----------+------------+---------+-----------+----------+-------+ Cephalic      Full                                          +----------+------------+---------+-----------+----------+-------+ Basilic       Full                                          +----------+------------+---------+-----------+----------+-------+  Summary:  Right: No evidence of thrombosis in the subclavian.  Left: No evidence of deep vein thrombosis in the upper extremity. No evidence of superficial vein thrombosis in the upper extremity.  *See table(s) above for measurements and observations.    Preliminary      Procedures   Medications Ordered in the ED  ceFAZolin (ANCEF) IVPB 1 g/50 mL premix (0 g Intravenous Stopped 01/21/24 1841)  Medical Decision Making Amount and/or Complexity of Data Reviewed Labs: ordered.  Risk Prescription drug management.   43 year old male presents emergency department with concern for thoughts of self-harm/suicide as well as redness of the left hand.  Patient actively injects drugs along his forearms and hands.  He is right-hand dominant.  No fever or systemic symptoms.  Vitals are normal and stable on arrival.  He has obvious track marks like scarring along the forearms and hands.  The dorsum of the left hand is slightly edematous and red but no significant tracking going up the arm.  Equal palpable radial pulse.   DVT study is negative.  Blood work shows no leukocytosis.  Given IV dose of Ancef and will plan to transition to p.o. antibiotics in regards to mild left hand cellulitis.  TTS evaluation is in process.  Psychiatry recommends inpatient treatment.  Will place the patient in psychiatric hold until placed.  Patient stable.     Final diagnoses:  Cellulitis of left upper extremity  Suicidal thoughts  Substance abuse [facility])    ED Discharge Orders          Ordered    sulfamethoxazole -trimethoprim  (BACTRIM  DS) 800-160 MG tablet  2 times daily        01/21/24 1932               Latesia Norrington, Roxie HERO, DO 01/21/24 2231

## 2024-01-22 ENCOUNTER — Other Ambulatory Visit (HOSPITAL_COMMUNITY)
Admission: EM | Admit: 2024-01-22 | Discharge: 2024-01-25 | Payer: MEDICAID | Source: Intra-hospital | Attending: Psychiatry | Admitting: Psychiatry

## 2024-01-22 ENCOUNTER — Encounter (HOSPITAL_COMMUNITY): Payer: Self-pay | Admitting: Nurse Practitioner

## 2024-01-22 DIAGNOSIS — Z79899 Other long term (current) drug therapy: Secondary | ICD-10-CM | POA: Insufficient documentation

## 2024-01-22 DIAGNOSIS — R45851 Suicidal ideations: Secondary | ICD-10-CM | POA: Insufficient documentation

## 2024-01-22 DIAGNOSIS — F1410 Cocaine abuse, uncomplicated: Secondary | ICD-10-CM | POA: Insufficient documentation

## 2024-01-22 DIAGNOSIS — F5105 Insomnia due to other mental disorder: Secondary | ICD-10-CM

## 2024-01-22 MED ORDER — DIPHENHYDRAMINE HCL 50 MG/ML IJ SOLN: 50.0000 mg | Freq: Three times a day (TID) | INTRAMUSCULAR | Status: DC | PRN

## 2024-01-22 MED ORDER — HYDROXYZINE HCL 25 MG PO TABS: 25.0000 mg | ORAL_TABLET | Freq: Three times a day (TID) | ORAL | Status: DC | PRN

## 2024-01-22 MED ORDER — HALOPERIDOL LACTATE 5 MG/ML IJ SOLN: 5.0000 mg | Freq: Three times a day (TID) | INTRAMUSCULAR | Status: DC | PRN

## 2024-01-22 MED ORDER — MAGNESIUM HYDROXIDE 400 MG/5ML PO SUSP: 30.0000 mL | Freq: Every day | ORAL | Status: DC | PRN

## 2024-01-22 MED ORDER — LORAZEPAM 2 MG/ML IJ SOLN: 2.0000 mg | Freq: Three times a day (TID) | INTRAMUSCULAR | Status: DC | PRN

## 2024-01-22 MED ORDER — ALUM & MAG HYDROXIDE-SIMETH 200-200-20 MG/5ML PO SUSP: 30.0000 mL | ORAL | Status: DC | PRN

## 2024-01-22 MED ORDER — DIPHENHYDRAMINE HCL 50 MG PO CAPS: 50.0000 mg | ORAL_CAPSULE | Freq: Three times a day (TID) | ORAL | Status: DC | PRN

## 2024-01-22 MED ORDER — SULFAMETHOXAZOLE-TRIMETHOPRIM 800-160 MG PO TABS
1.0000 | ORAL_TABLET | Freq: Two times a day (BID) | ORAL | Status: DC
  Administered 2024-01-22 – 2024-01-25 (×7): 1 via ORAL
  Filled 2024-01-22 (×4): qty 1
  Filled 2024-01-22: qty 8
  Filled 2024-01-22 (×3): qty 1

## 2024-01-22 MED ORDER — HALOPERIDOL 5 MG PO TABS: 5.0000 mg | ORAL_TABLET | Freq: Three times a day (TID) | ORAL | Status: DC | PRN

## 2024-01-22 MED ORDER — TRAZODONE HCL 50 MG PO TABS
50.0000 mg | ORAL_TABLET | Freq: Every evening | ORAL | Status: DC | PRN
  Administered 2024-01-22 – 2024-01-24 (×3): 50 mg via ORAL
  Filled 2024-01-22 (×3): qty 1

## 2024-01-22 MED ORDER — ACETAMINOPHEN 325 MG PO TABS
650.0000 mg | ORAL_TABLET | Freq: Four times a day (QID) | ORAL | Status: DC | PRN
  Administered 2024-01-23: 650 mg via ORAL
  Filled 2024-01-22: qty 2

## 2024-01-22 MED ORDER — HALOPERIDOL LACTATE 5 MG/ML IJ SOLN: 10.0000 mg | Freq: Three times a day (TID) | INTRAMUSCULAR | Status: DC | PRN

## 2024-01-22 NOTE — ED Notes (Signed)
°   01/22/24 1156  Psych Admission Type (Psych Patients Only)  Admission Status Voluntary  Psychosocial Assessment  Patient Complaints Substance abuse  Eye Contact Fair  Facial Expression (S)  Other (Comment) (WNL)  Affect Appropriate to circumstance  Speech Logical/coherent  Interaction Assertive  Motor Activity  (steady)  Appearance/Hygiene Unremarkable  Behavior Characteristics Cooperative;Appropriate to situation  Mood Pleasant  Aggressive Behavior  Effect No apparent injury  Thought Process  Coherency WDL  Content WDL  Delusions WDL  Perception WDL  Hallucination None reported or observed  Judgment WDL  Confusion WDL  Danger to Self  Current suicidal ideation? Denies  Description of Suicide Plan denies  Agreement Not to Harm Self Yes  Description of Agreement verbal  Danger to Others  Danger to Others None reported or observed

## 2024-01-22 NOTE — Progress Notes (Signed)
 Pt resting quietly in room.  Breathing even and unlabored. No distress noted.

## 2024-01-22 NOTE — ED Notes (Signed)
 Pt sitting in dayroom interacting with peers. No acute distress noted. Continue to monitor for safety.

## 2024-01-22 NOTE — ED Provider Notes (Signed)
 Behavioral Health Progress Note  Date and Time: 01/22/2024 3:18 PM Name: Colin Davidson MRN:  [id number]  HPI: Colin Davidson is a 43 year old male who presented to [facility] emergency department on 01/21/2024.  Patient reported suicidal ideation upon arrival to emergency department.  Patient also reported infection to his left hand.  Infection subsequent to patient's use of cocaine via injection.  Bactrim  initiated at ED, patient transported to [facility] behavioral health facility based crisis for further treatment and stabilization of SI today at 04:45 AM.  Patient remains voluntary.   Per initial admission assessment: 01/22/2024-0445am Patient is a 43 year old male with hx of polysubstance abuse. patient was received from the Emergency Department to [facility] in stable condition. Patient endorses a long history of substance abuse, mainly cocaine, with recent IV use; says he has use about 12 grams in the last 3-4 day, last use was yesterday. He reports injecting at multiple sites on bilateral arms and has an infection of the left hand, without associated fever or systemic symptoms. Patient states motivation to quit and participate in substance abuse treatment. He reports being 6 months sober from alcohol use. He is alert and oriented x4. Speech is clear, normal in tone and rate. Mood is euthymic, affect is congruent with mood. No evidence of delusional thought content. He is endorsing passive suicidal ideation without plan or intent. Denies homicidal ideation, auditory or visual hallucinations, paranoia, or other psychotic symptoms.    Patient assessment, 01/22/2024: Chart reviewed and patient discussed with attending psychiatrist, Dr. Lawrnce on 01/22/2024. On assessment today patient is alert and oriented, pleasant and cooperative during assessment.  He presents with euthymic mood, congruent affect. Subjective: Patient states I came to the emergency department because I was afraid I was going to  hurt myself with drugs.  I had 90 days clean and then I went on a 3-day binge.  I know how I am and I do not stop.  I had been awake for 3 days and I was not eating or drinking.  I was not thinking of hurting myself.  Prior to relapse, 3 days ago, patient reports attending average of 25 hours of recovery focused counseling and meetings each week.  He is active with AA/NA and is currently seeking sponsorship.  He is also treated outpatient at the [facility].  Sleep=average. Appetite =improving. Concentration is good. Energy level is good today. He denies suicidal thoughts.  Denies suicidal intent and plan.  Denies history of suicide attempts, denies history of nonsuicidal self-harm. Denies having any HI.  Denies having psychotic symptoms.  Patient denies auditory or visual hallucinations, denies paranoia or delusional thinking.  Patient denies first rank symptoms. Denies delusional thoughts, and there is no current evidence of such.   Denies having side effects to current medications.  Patient states my hand looks a lot better today, I cannot see the swelling anymore.  Colin Davidson reports history of outpatient psychiatry follow-up with multiple medication trials.  Colin Davidson states I do not like to take meds, I have a lot of side effects.  I have had serotonin syndrome multiple times and antipsychotics are not a good fit for me.   We discussed continuing medications as ordered, patient aware of necessity to complete ABX Rx completely.    Subjective: Patient states I have tried short term and I know what I need I need long-term (substance use treatment) if I want to be successful.  Diagnosis:  Final diagnoses:  Cocaine use disorder (HCC)  Total Time spent with patient: 20 minutes  Past Psychiatric History: Cocaine use disorder, substance-induced mood disorder Past Medical History: Chest pain syndrome, inguinal hernia, palpitations, peptic ulcer disease, cellulitis Family  History: None reported Family Psychiatric  History: None reported Social History: Resides in [place], denies access to weapons, endorses cocaine use x 3 days, history alcohol use   Additional Social History:                         Sleep: Good  Appetite:  Fair  Current Medications:  Current Facility-Administered Medications  Medication Dose Route Frequency Provider Last Rate Last Admin   acetaminophen  (TYLENOL ) tablet 650 mg  650 mg Oral Q6H PRN Ajibola, Ene A, NP       alum & mag hydroxide-simeth (MAALOX/MYLANTA) 200-200-20 MG/5ML suspension 30 mL  30 mL Oral Q4H PRN Ajibola, Ene A, NP       haloperidol  (HALDOL ) tablet 5 mg  5 mg Oral TID PRN Ajibola, Ene A, NP       And   diphenhydrAMINE  (BENADRYL ) capsule 50 mg  50 mg Oral TID PRN Ajibola, Ene A, NP       haloperidol  lactate (HALDOL ) injection 5 mg  5 mg Intramuscular TID PRN Ajibola, Ene A, NP       And   diphenhydrAMINE  (BENADRYL ) injection 50 mg  50 mg Intramuscular TID PRN Ajibola, Ene A, NP       And   LORazepam  (ATIVAN ) injection 2 mg  2 mg Intramuscular TID PRN Ajibola, Ene A, NP       haloperidol  lactate (HALDOL ) injection 10 mg  10 mg Intramuscular TID PRN Ajibola, Ene A, NP       And   diphenhydrAMINE  (BENADRYL ) injection 50 mg  50 mg Intramuscular TID PRN Ajibola, Ene A, NP       And   LORazepam  (ATIVAN ) injection 2 mg  2 mg Intramuscular TID PRN Ajibola, Ene A, NP       hydrOXYzine  (ATARAX ) tablet 25 mg  25 mg Oral TID PRN Ajibola, Ene A, NP       magnesium  hydroxide (MILK OF MAGNESIA) suspension 30 mL  30 mL Oral Daily PRN Ajibola, Ene A, NP       sulfamethoxazole -trimethoprim  (BACTRIM  DS) 800-160 MG per tablet 1 tablet  1 tablet Oral Q12H Ajibola, Ene A, NP   1 tablet at 01/22/24 0956   traZODone  (DESYREL ) tablet 50 mg  50 mg Oral QHS PRN Ajibola, Ene A, NP       No current outpatient medications on file.    Labs  Lab Results:  Admission on 01/21/2024, Discharged on 01/22/2024  Component Date  Value Ref Range Status   Sodium 01/21/2024 139  135 - 145 mmol/L Final   Potassium 01/21/2024 4.0  3.5 - 5.1 mmol/L Final   Chloride 01/21/2024 103  98 - 111 mmol/L Final   CO2 01/21/2024 26  22 - 32 mmol/L Final   Glucose, Bld 01/21/2024 96  70 - 99 mg/dL Final   Glucose reference range applies only to samples taken after fasting for at least 8 hours.   BUN 01/21/2024 11  6 - 20 mg/dL Final   Creatinine, Ser 01/21/2024 0.78  0.61 - 1.24 mg/dL Final   Calcium 87/87/7974 9.3  8.9 - 10.3 mg/dL Final   Total Protein 87/87/7974 7.5  6.5 - 8.1 g/dL Final   Albumin 87/87/7974 4.3  3.5 - 5.0 g/dL Final   AST 87/87/7974  38  15 - 41 U/L Final   ALT 01/21/2024 20  0 - 44 U/L Final   Alkaline Phosphatase 01/21/2024 104  38 - 126 U/L Final   Total Bilirubin 01/21/2024 0.2  0.0 - 1.2 mg/dL Final   GFR, Estimated 01/21/2024 >60  >60 mL/min Final   Comment: (NOTE) Calculated using the CKD-EPI Creatinine Equation (2021)    Anion gap 01/21/2024 11  5 - 15 Final   Performed at [facility], [address], [place], [place] [address]   Alcohol, Ethyl (B) 01/21/2024 <15  <15 mg/dL Final   Comment: (NOTE) For medical purposes only. Performed at [facility], [address] [place], [place] [address]    WBC 01/21/2024 8.0  4.0 - 10.5 K/uL Final   RBC 01/21/2024 5.06  4.22 - 5.81 MIL/uL Final   Hemoglobin 01/21/2024 15.8  13.0 - 17.0 g/dL Final   HCT 87/87/7974 47.2  39.0 - 52.0 % Final   MCV 01/21/2024 93.3  80.0 - 100.0 fL Final   MCH 01/21/2024 31.2  26.0 - 34.0 pg Final   MCHC 01/21/2024 33.5  30.0 - 36.0 g/dL Final   RDW 87/87/7974 13.2  11.5 - 15.5 % Final   Platelets 01/21/2024 231  150 - 400 K/uL Final   nRBC 01/21/2024 0.0  0.0 - 0.2 % Final   Performed at [facility], [address], [place], [place] [address]   Opiates 01/21/2024 NEGATIVE  NEGATIVE Final   Cocaine 01/21/2024 POSITIVE (A)  NEGATIVE Final   Benzodiazepines  01/21/2024 NEGATIVE  NEGATIVE Final   Amphetamines 01/21/2024 NEGATIVE  NEGATIVE Final   Tetrahydrocannabinol 01/21/2024 NEGATIVE  NEGATIVE Final   Barbiturates 01/21/2024 NEGATIVE  NEGATIVE Final   Methadone Scn, Ur 01/21/2024 NEGATIVE  NEGATIVE Final   Fentanyl  01/21/2024 NEGATIVE  NEGATIVE Final   Comment: (NOTE) Drug screen is for Medical Purposes only. Positive results are preliminary only. If confirmation is needed, notify lab within 5 days.  Drug Class                 Cutoff (ng/mL) Amphetamine  and metabolites 1000 Barbiturate and metabolites 200 Benzodiazepine              200 Opiates and metabolites     300 Cocaine and metabolites     300 THC                         50 Fentanyl                     5 Methadone                   300  Trazodone  is metabolized in vivo to several metabolites,  including pharmacologically active m-CPP, which is excreted in the  urine.  Immunoassay screens for amphetamines and MDMA have potential  cross-reactivity with these compounds and may provide false positive  result.  Performed at [facility], [address] [place], [place] [address]    Salicylate Lvl 01/21/2024 <7.0 (L)  7.0 - 30.0 mg/dL Final   Performed at [facility], [address] [place], [place] [address]   Acetaminophen  (Tylenol ), Serum 01/21/2024 <10 (L)  10 - 30 ug/mL Final   Comment: (NOTE) Toxic concentrations can be more effectively related to post dose interval; >200, >100, and >50 ug/mL serum concentrations correspond to toxic concentrations at 4, 8, and 12 hours post  dose, respectively.  Performed at [facility], [address] [place], [place] [address]     Blood Alcohol level:  Lab Results  Component Value Date   [facility] <15 01/21/2024   [facility]  08/07/2009    <5        LOWEST DETECTABLE LIMIT FOR SERUM ALCOHOL IS 5 mg/dL FOR MEDICAL PURPOSES ONLY    Metabolic Disorder Labs: No results found  for: HGBA1C, MPG No results found for: PROLACTIN No results found for: CHOL, TRIG, HDL, CHOLHDL, VLDL, LDLCALC  Therapeutic Lab Levels: No results found for: LITHIUM No results found for: VALPROATE No results found for: CBMZ  Physical Findings   PHQ2-9    Flowsheet Row ED from 01/22/2024 in [facility]  PHQ-2 Total Score 0   Flowsheet Row ED from 01/22/2024 in [facility] ED from [facility]/21/2024 in [facility] Emergency Department at [facility]  C-SSRS RISK CATEGORY High Risk High Risk     Musculoskeletal  Strength & Muscle Tone: within normal limits Gait & Station: normal Patient leans: N/A  Psychiatric Specialty Exam  Presentation  General Appearance:  Appropriate for Environment; Casual  Eye Contact: Good  Speech: Clear and Coherent; Normal Rate  Speech Volume: Normal  Handedness: Right   Mood and Affect  Mood: Euthymic  Affect: Appropriate; Congruent   Thought Process  Thought Processes: Goal Directed; Linear; Coherent  Descriptions of Associations:Intact  Orientation:Full (Time, Place and Person)  Thought Content:Logical; WDL  Diagnosis of Schizophrenia or Schizoaffective disorder in past: No    Hallucinations:Hallucinations: None  Ideas of Reference:None  Suicidal Thoughts:Suicidal Thoughts: No  Homicidal Thoughts:Homicidal Thoughts: No   Sensorium  Memory: Immediate Good; Recent Fair  Judgment: Fair  Insight: Fair   Art Therapist  Concentration: Good  Attention Span: Good  Recall: Good  Fund of Knowledge: Good  Language: Good   Psychomotor Activity  Psychomotor Activity: Psychomotor Activity: Normal   Assets  Assets: Communication Skills; Desire for Improvement; Financial Resources/Insurance; Housing; Social Support; Resilience; Physical Health   Sleep  Sleep: Sleep: Fair  Estimated Sleeping Duration (Last  24 Hours): 0.00 hours  Nutritional Assessment (For OBS and FBC admissions only) Has the patient had a weight loss or gain of 10 pounds or more in the last 3 months?: No Has the patient had a decrease in food intake/or appetite?: No Does the patient have dental problems?: No Does the patient have eating habits or behaviors that may be indicators of an eating disorder including binging or inducing vomiting?: No Has the patient recently lost weight without trying?: 0 Has the patient been eating poorly because of a decreased appetite?: 0 Malnutrition Screening Tool Score: 0    Physical Exam  Physical Exam Vitals and nursing note reviewed.  Constitutional:      Appearance: Normal appearance. He is well-developed and normal weight.  HENT:     Head: Normocephalic and atraumatic.     Nose: Nose normal.  Cardiovascular:     Rate and Rhythm: Normal rate.  Pulmonary:     Effort: Pulmonary effort is normal.  Musculoskeletal:        General: Normal range of motion.     Cervical back: Normal range of motion.  Skin:    General: Skin is warm and dry.  Neurological:     Mental Status: He is alert and oriented to person, place, and time.  Psychiatric:        Attention and Perception: Attention and perception  normal.        Mood and Affect: Mood and affect normal.        Speech: Speech normal.        Behavior: Behavior normal. Behavior is cooperative.        Thought Content: Thought content normal.        Cognition and Memory: Cognition and memory normal.    Review of Systems  Constitutional: Negative.   HENT: Negative.    Eyes: Negative.   Respiratory: Negative.    Cardiovascular: Negative.   Gastrointestinal: Negative.   Genitourinary: Negative.   Musculoskeletal: Negative.   Skin: Negative.   Neurological: Negative.   Psychiatric/Behavioral:  Positive for substance abuse.    Blood pressure 100/62, pulse 88, temperature 98.4 F (36.9 C), resp. rate 18, SpO2 98%. There is no  height or weight on file to calculate BMI.  Treatment Plan Summary: Daily contact with patient to assess and evaluate symptoms and progress in treatment Patient has caseworker Loretta who is actively assisting in the search for residential substance use treatment.  Patient is not able to recall which agency or his specific connection with Loretta.  Labs Reviewed: No new lab orders placed.  01/21/2024 UDS positive cocaine.   EKG from 01/22/2024 sinus bradycardia, beats per minute 59, QTc .  Medication management: Continue current medications: -Acetaminophen  650 mg every 6 hours, as needed/mild pain -Maalox 30 mL oral every 4 hours, as needed/digestion -Hydroxyzine  25 mg 3 times daily as needed/anxiety -Magnesium  hydroxide 30 mL daily as needed/mild constipation -sulfamethoxazole -trimethoprim  (BACTRIM  DS) 800-160 MG per tablet 1 tablet x 7 days total -Trazodone  50 mg nightly, as needed/sleep   Agitation protocol: MILD -Haloperidol  5 mg 3 times daily as needed mild agitation  -Diphenhydramine  50 mg p.o. 3 times daily as needed mild agitation  MODERATE -Haloperidol  5 mg IM 3 times daily as needed/moderate agitation -Diphenhydramine  50 mg IM 3 times daily as needed/moderate agitation -Lorazepam  2 mg IM 3 times daily as needed/moderate agitation  SEVERE -Haloperidol  10 mg IM 3 times daily as needed severe agitation -Diphenhydramine  50 mg IM 3 times daily as needed/severe agitation -Lorazepam  2 mg IM 3 times daily as needed/severe agitation     Discharge Planning: Disposition/case management to assist with discharge planning and identification of follow-up needs including residential or outpatient substance use treatment options as needed, prior to discharge.  Patient request rebound program in [place] .  Patient would like to seek residential substance use treatment in [place] area as his wife is currently seeking residential substance use treatment near  [place]. Discharge Concerns: Need to confirm safety plan; Medication compliance and effectiveness Discharge Goals: Return home with outpatient referrals for mental health follow-up including medication management/psychotherapy and substance use treatment follow-up resources as appropriate   Ellouise LITTIE Dawn, FNP 01/22/2024 3:18 PM

## 2024-01-22 NOTE — Group Note (Signed)
 Group Topic: Recovery Basics  Group Date: 01/22/2024 Start Time: 2000 End Time: 2030 Facilitators: Verdon Jacqualyn BRAVO, NT  Department: [facility]  Number of Participants: 9  Group Focus: chemical dependency issues Treatment Modality:  Individual Therapy Interventions utilized were group exercise Purpose: relapse prevention strategies  Name: olita LULLA Molt Date of Birth: 26-Sep-1980  MR: [id number]    Level of Participation: active Quality of Participation: cooperative Interactions with others: gave feedback Mood/Affect: appropriate Triggers (if applicable): n/a Cognition: coherent/clear Progress: Moderate Response: n/a Plan: follow-up needed  Patients Problems:  Patient Active Problem List   Diagnosis Date Noted   Cocaine abuse (HCC) 01/22/2024

## 2024-01-22 NOTE — ED Provider Notes (Signed)
 Patient is a 43 year old male with hx of polysubstance abuse. patient was received from the Emergency Department to [facility] in stable condition. Patient endorses a long history of substance abuse, mainly cocaine, with recent IV use; says he has use about 12 grams in the last 3-4 day, last use was yesterday. He reports injecting at multiple sites on bilateral arms and has an infection of the left hand, without associated fever or systemic symptoms. Patient states motivation to quit and participate in substance abuse treatment. He reports being 6 months sober from alcohol use. He is alert and oriented x4. Speech is clear, normal in tone and rate. Mood is euthymic, affect is congruent with mood. No evidence of delusional thought content. He is endorsing passive suicidal ideation without plan or intent. Denies homicidal ideation, auditory or visual hallucinations, paranoia, or other psychotic symptoms.

## 2024-01-22 NOTE — Group Note (Signed)
 Group Topic: Healthy Self Image and Positive Change  Group Date: 01/22/2024 Start Time: 1300 End Time: 1330 Facilitators: Laneta Renea POUR, NT  Department: [facility]  Number of Participants: 2  Group Focus: coping skills Treatment Modality:  Psychoeducation Interventions utilized were problem solving Purpose: enhance coping skills and increase insight  Name: Colin Davidson Date of Birth: September 29, 1980  MR: [id number]    Level of Participation: minimal Quality of Participation: cooperative and motivated Interactions with others: gave feedback Mood/Affect: bright and positive Triggers (if applicable): None Cognition: coherent/clear and insightful Progress: Gaining insight Response: left the room at the time, but remains positive.  Plan: patient will be encouraged to attend group.   Patients Problems:  Patient Active Problem List   Diagnosis Date Noted   Cocaine abuse (HCC) 01/22/2024

## 2024-01-22 NOTE — Group Note (Signed)
 Group Topic: Relapse and Recovery  Group Date: 01/22/2024 Start Time: 0830 End Time: 0900 Facilitators: Belle Camellia SAILOR, RN  Department: [facility]  Number of Participants: 8  Group Focus: substance abuse education Treatment Modality:  Solution-Focused Therapy Interventions utilized were patient education Purpose: relapse prevention strategies  Name: olita LULLA Molt Date of Birth: 02-07-1981  MR: [id number]    Level of Participation: minimal Quality of Participation: cooperative Interactions with others: gave feedback Mood/Affect: appropriate Triggers (if applicable):  Cognition: coherent/clear Progress: Minimal Response:  Plan: follow-up needed  Patients Problems:  Patient Active Problem List   Diagnosis Date Noted   Cocaine abuse (HCC) 01/22/2024

## 2024-01-22 NOTE — ED Notes (Signed)
 Pt denies SI/HI/AVH. No reports of pain. Compliant with scheduled meds; prn trazodone  given. Pt attended AA group this evening. He was calm, cooperative, and pleasant. No behavioral concerns at this time. Currently sleeping in bed, no acute distress noted. Respirations even and unlabored. Continue to monitor for safety.

## 2024-01-23 DIAGNOSIS — Z79899 Other long term (current) drug therapy: Secondary | ICD-10-CM | POA: Diagnosis not present

## 2024-01-23 DIAGNOSIS — F1410 Cocaine abuse, uncomplicated: Secondary | ICD-10-CM | POA: Diagnosis not present

## 2024-01-23 DIAGNOSIS — R45851 Suicidal ideations: Secondary | ICD-10-CM | POA: Diagnosis not present

## 2024-01-23 NOTE — ED Notes (Signed)
 Pt socializing in dayroom with peers, no signs of distress observed or reported to RN.

## 2024-01-23 NOTE — Group Note (Signed)
 Group Topic: Emotional Regulation  Group Date: 01/23/2024 Start Time: 2100 End Time: 2145 Facilitators: Luvenia Mae SAUNDERS, NT  Department: [facility]  Number of Participants: 6  Group Focus: anxiety Treatment Modality:  Leisure Development Interventions utilized were assignment Purpose: express feelings and increase insight  Name: Colin Davidson Date of Birth: 02/03/1981  MR: [id number]    Level of Participation: active Quality of Participation: attentive Interactions with others: gave feedback Mood/Affect: appropriate Triggers (if applicable): none Cognition: coherent/clear Progress: Gaining insight Response: none Plan: patient will be encouraged to continue with group  Patients Problems:  Patient Active Problem List   Diagnosis Date Noted   Cocaine abuse (HCC) 01/22/2024

## 2024-01-23 NOTE — Group Note (Signed)
 Group Topic: Understanding Self  Group Date: 01/23/2024 Start Time: 1210 End Time: 1245 Facilitators: Daved Tinnie HERO, RN  Department: [facility]  Number of Participants: 8  Group Focus: psychiatric education Treatment Modality:  Psychoeducation Interventions utilized were exploration Purpose: increase insight  Name: Colin Davidson Date of Birth: [birth year]  MR: [id number]    Level of Participation: active Quality of Participation: attentive and cooperative Interactions with others: gave feedback Mood/Affect: appropriate Triggers (if applicable): n/a Cognition: coherent/clear Progress: Gaining insight Response: pt reports going to meetings and recovery programing as a self care/ protective factor that would be helpful to them  Plan: patient will be encouraged to attend future RN education groups  Patients Problems:  Patient Active Problem List   Diagnosis Date Noted   Cocaine abuse (HCC) 01/22/2024

## 2024-01-23 NOTE — ED Notes (Signed)
 Pt denies SI/HI/AVH. No reports of pain. Compliant with scheduled meds; prn trazodone  given. Pt was calm, cooperative, and pleasant. No behavioral concerns at this time. Currently sleeping in bed, no acute distress noted. Respirations even and unlabored. Continue to monitor for safety.

## 2024-01-23 NOTE — ED Notes (Signed)
 Pt sleeping in bed, no acute distress noted. Respirations even and unlabored. Continue to monitor for safety.

## 2024-01-23 NOTE — ED Notes (Signed)
 Pt reports feeling 'pretty good'. Pt ate breakfast, denies si hi avh, verbal contact for safety provided. Pt c/o level 3/10 L hand pain, prn tylenol  administered. RN reviewed medications with pt , questions denied.

## 2024-01-23 NOTE — ED Provider Notes (Signed)
 Behavioral Health Progress Note  Date and Time: 01/23/2024 10:32 AM Name: Colin Davidson MRN:  [id number]  HPI: Colin Davidson presented to [facility] emergency department on 01/21/2024, he is a 43 year old male.  Upon arrival to emergency department patient endorsed suicidal ideation and infection to left hand.  Infection stems from patient's IVDU, cocaine.  ABX initiated, admission to facility based crisis at [facility] health on 01/22/2024, patient voluntary.   Per initial admission assessment:  01/22/2024-0445am Patient is a 43 year old male with hx of polysubstance abuse. patient was received from the Emergency Department to [facility] in stable condition. Patient endorses a long history of substance abuse, mainly cocaine, with recent IV use; says he has use about 12 grams in the last 3-4 day, last use was yesterday. He reports injecting at multiple sites on bilateral arms and has an infection of the left hand, without associated fever or systemic symptoms. Patient states motivation to quit and participate in substance abuse treatment. He reports being 6 months sober from alcohol use. He is alert and oriented x4. Speech is clear, normal in tone and rate. Mood is euthymic, affect is congruent with mood. No evidence of delusional thought content. He is endorsing passive suicidal ideation without plan or intent. Denies homicidal ideation, auditory or visual hallucinations, paranoia, or other psychotic symptoms.    Patient reassessment, 01/23/2024: Chart reviewed and patient discussed with attending psychiatrist, Dr. Lawrnce. On assessment today, patient is seated, no apparent distress.  He is alert and oriented, pleasant and cooperative during assessment.  Patient presents with euthymic mood, bright affect. Hudsyn endorses mild dizziness, decreasing times approximately 2 days.  He reports this is potentially related to cessation of cocaine, has experienced before.  Reviewed plan to include  fall precautions, will sit up for approximately 2 to 3 minutes and confirm not dizzy prior to standing.  Will rise slowly and call out for staff if feeling dizzy.  Encouraged continued increased PO intake. Patient states I am so good, I have had good rest and good meals here. Sleep is good.  Patient reports given trazodone  50 mg nightly on last night. Appetite is good. Concentration is average. Patient endorsed typical energy level. Denies symptoms of withdrawal. He is continuing to deny suicidal thoughts.  Denies suicidal intent and plan.  Denies having any HI.  Denies having psychotic symptoms.  Patient denies auditory or visual hallucinations, denies paranoia or delusional thinking.  Patient denies first rank symptoms. Denies delusional thoughts, and there is no current evidence of such. Patient left hand without evidence of inflammation.  Patient states it is so much better than it was before.  He denies physical pain.   Denies having side effects to current medications.    We discussed continuing current medications.     Diagnosis:  Final diagnoses:  Cocaine use disorder (HCC)    Total Time spent with patient: 20 minutes Past Psychiatric History: Cocaine use disorder, substance-induced mood disorder Past Medical History: Chest pain syndrome, inguinal hernia, palpitations, peptic ulcer disease, cellulitis Family History: None reported Family Psychiatric  History: None reported Social History: Resides in [place], denies access to weapons, endorses cocaine use x 3 days, history alcohol use    Additional Social History:           Sleep: Good  Appetite:  Good  Current Medications:  Current Facility-Administered Medications  Medication Dose Route Frequency Provider Last Rate Last Admin   acetaminophen  (TYLENOL ) tablet 650 mg  650 mg Oral Q6H PRN Ajibola, Ene  A, NP   650 mg at 01/23/24 0927   alum & mag hydroxide-simeth (MAALOX/MYLANTA) 200-200-20 MG/5ML  suspension 30 mL  30 mL Oral Q4H PRN Ajibola, Ene A, NP       haloperidol  (HALDOL ) tablet 5 mg  5 mg Oral TID PRN Ajibola, Ene A, NP       And   diphenhydrAMINE  (BENADRYL ) capsule 50 mg  50 mg Oral TID PRN Ajibola, Ene A, NP       haloperidol  lactate (HALDOL ) injection 5 mg  5 mg Intramuscular TID PRN Ajibola, Ene A, NP       And   diphenhydrAMINE  (BENADRYL ) injection 50 mg  50 mg Intramuscular TID PRN Ajibola, Ene A, NP       And   LORazepam  (ATIVAN ) injection 2 mg  2 mg Intramuscular TID PRN Ajibola, Ene A, NP       haloperidol  lactate (HALDOL ) injection 10 mg  10 mg Intramuscular TID PRN Ajibola, Ene A, NP       And   diphenhydrAMINE  (BENADRYL ) injection 50 mg  50 mg Intramuscular TID PRN Ajibola, Ene A, NP       And   LORazepam  (ATIVAN ) injection 2 mg  2 mg Intramuscular TID PRN Ajibola, Ene A, NP       hydrOXYzine  (ATARAX ) tablet 25 mg  25 mg Oral TID PRN Ajibola, Ene A, NP       magnesium  hydroxide (MILK OF MAGNESIA) suspension 30 mL  30 mL Oral Daily PRN Ajibola, Ene A, NP       sulfamethoxazole -trimethoprim  (BACTRIM  DS) 800-160 MG per tablet 1 tablet  1 tablet Oral Q12H Gottfried, Rhoda J, MD   1 tablet at 01/23/24 9072   traZODone  (DESYREL ) tablet 50 mg  50 mg Oral QHS PRN Ajibola, Ene A, NP   50 mg at 01/22/24 2140   No current outpatient medications on file.    Labs  Lab Results:  Admission on 01/21/2024, Discharged on 01/22/2024  Component Date Value Ref Range Status   Sodium 01/21/2024 139  135 - 145 mmol/L Final   Potassium 01/21/2024 4.0  3.5 - 5.1 mmol/L Final   Chloride 01/21/2024 103  98 - 111 mmol/L Final   CO2 01/21/2024 26  22 - 32 mmol/L Final   Glucose, Bld 01/21/2024 96  70 - 99 mg/dL Final   Glucose reference range applies only to samples taken after fasting for at least 8 hours.   BUN 01/21/2024 11  6 - 20 mg/dL Final   Creatinine, Ser 01/21/2024 0.78  0.61 - 1.24 mg/dL Final   Calcium 87/87/7974 9.3  8.9 - 10.3 mg/dL Final   Total Protein 87/87/7974 7.5   6.5 - 8.1 g/dL Final   Albumin 87/87/7974 4.3  3.5 - 5.0 g/dL Final   AST 87/87/7974 38  15 - 41 U/L Final   ALT 01/21/2024 20  0 - 44 U/L Final   Alkaline Phosphatase 01/21/2024 104  38 - 126 U/L Final   Total Bilirubin 01/21/2024 0.2  0.0 - 1.2 mg/dL Final   GFR, Estimated 01/21/2024 >60  >60 mL/min Final   Comment: (NOTE) Calculated using the CKD-EPI Creatinine Equation (2021)    Anion gap 01/21/2024 11  5 - 15 Final   Performed at [facility], [address] [place], [place] [address]   Alcohol, Ethyl (B) 01/21/2024 <15  <15 mg/dL Final   Comment: (NOTE) For medical purposes only. Performed at [facility], [address]  [place], [place] [address]    WBC 01/21/2024 8.0  4.0 - 10.5 K/uL Final   RBC 01/21/2024 5.06  4.22 - 5.81 MIL/uL Final   Hemoglobin 01/21/2024 15.8  13.0 - 17.0 g/dL Final   HCT 87/87/7974 47.2  39.0 - 52.0 % Final   MCV 01/21/2024 93.3  80.0 - 100.0 fL Final   MCH 01/21/2024 31.2  26.0 - 34.0 pg Final   MCHC 01/21/2024 33.5  30.0 - 36.0 g/dL Final   RDW 87/87/7974 13.2  11.5 - 15.5 % Final   Platelets 01/21/2024 231  150 - 400 K/uL Final   nRBC 01/21/2024 0.0  0.0 - 0.2 % Final   Performed at [facility], [address], [place], [place] [address]   Opiates 01/21/2024 NEGATIVE  NEGATIVE Final   Cocaine 01/21/2024 POSITIVE (A)  NEGATIVE Final   Benzodiazepines 01/21/2024 NEGATIVE  NEGATIVE Final   Amphetamines 01/21/2024 NEGATIVE  NEGATIVE Final   Tetrahydrocannabinol 01/21/2024 NEGATIVE  NEGATIVE Final   Barbiturates 01/21/2024 NEGATIVE  NEGATIVE Final   Methadone Scn, Ur 01/21/2024 NEGATIVE  NEGATIVE Final   Fentanyl  01/21/2024 NEGATIVE  NEGATIVE Final   Comment: (NOTE) Drug screen is for Medical Purposes only. Positive results are preliminary only. If confirmation is needed, notify lab within 5 days.  Drug Class                 Cutoff (ng/mL) Amphetamine  and metabolites 1000 Barbiturate  and metabolites 200 Benzodiazepine              200 Opiates and metabolites     300 Cocaine and metabolites     300 THC                         50 Fentanyl                     5 Methadone                   300  Trazodone  is metabolized in vivo to several metabolites,  including pharmacologically active m-CPP, which is excreted in the  urine.  Immunoassay screens for amphetamines and MDMA have potential  cross-reactivity with these compounds and may provide false positive  result.  Performed at [facility], [address], [place], [place] [address]    Salicylate Lvl 01/21/2024 <7.0 (L)  7.0 - 30.0 mg/dL Final   Performed at [facility], [address], [place], [place] [address]   Acetaminophen  (Tylenol ), Serum 01/21/2024 <10 (L)  10 - 30 ug/mL Final   Comment: (NOTE) Toxic concentrations can be more effectively related to post dose interval; >200, >100, and >50 ug/mL serum concentrations correspond to toxic concentrations at 4, 8, and 12 hours post dose, respectively.  Performed at [facility], [address], [place], [place] [address]     Blood Alcohol level:  Lab Results  Component Value Date   [facility] <15 01/21/2024   [facility]  08/07/2009    <5        LOWEST DETECTABLE LIMIT FOR SERUM ALCOHOL IS 5 mg/dL FOR MEDICAL PURPOSES ONLY    Metabolic Disorder Labs: No results found for: HGBA1C, MPG No results found for: PROLACTIN No results found for: CHOL, TRIG, HDL, CHOLHDL, VLDL, LDLCALC  Therapeutic Lab Levels: No results found for: LITHIUM No results found for: VALPROATE No results found for: CBMZ  Physical Findings   PHQ2-9  Flowsheet Row ED from 01/22/2024 in [facility]  PHQ-2 Total Score 0   Flowsheet Row ED from 01/22/2024 in [facility] ED from 01/21/2024 in [facility] Emergency Department at [facility]   C-SSRS RISK CATEGORY High Risk High Risk     Musculoskeletal  Strength & Muscle Tone: within normal limits Gait & Station: normal Patient leans: N/A  Psychiatric Specialty Exam  Presentation  General Appearance:  Appropriate for Environment; Casual  Eye Contact: Good  Speech: Clear and Coherent; Normal Rate  Speech Volume: Normal  Handedness: Right   Mood and Affect  Mood: Euthymic  Affect: Appropriate; Congruent   Thought Process  Thought Processes: Coherent; Goal Directed; Linear  Descriptions of Associations:Intact  Orientation:Full (Time, Place and Person)  Thought Content:WDL; Logical  Diagnosis of Schizophrenia or Schizoaffective disorder in past: No    Hallucinations:Hallucinations: None  Ideas of Reference:None  Suicidal Thoughts:Suicidal Thoughts: No  Homicidal Thoughts:Homicidal Thoughts: No   Sensorium  Memory: Immediate Good; Recent Good  Judgment: Fair  Insight: Fair   Art Therapist  Concentration: Good  Attention Span: Good  Recall: Good  Fund of Knowledge: Good  Language: Good   Psychomotor Activity  Psychomotor Activity: Psychomotor Activity: Normal   Assets  Assets: Communication Skills; Desire for Improvement; Financial Resources/Insurance; Social Support; Resilience; Physical Health   Sleep  Sleep: Sleep: Good  Estimated Sleeping Duration (Last 24 Hours): 8.50-10.75 hours  Nutritional Assessment (For OBS and [facility]BC admissions only) Has the patient had a weight loss or gain of 10 pounds or more in the last 3 months?: No Has the patient had a decrease in food intake/or appetite?: No Does the patient have dental problems?: No Does the patient have eating habits or behaviors that may be indicators of an eating disorder including binging or inducing vomiting?: No Has the patient recently lost weight without trying?: 0 Has the patient been eating poorly because of a decreased appetite?:  0 Malnutrition Screening Tool Score: 0    Physical Exam  Physical Exam Vitals and nursing note reviewed.  Constitutional:      Appearance: Normal appearance. He is well-developed and normal weight.  HENT:     Head: Normocephalic and atraumatic.     Nose: Nose normal.  Cardiovascular:     Rate and Rhythm: Normal rate and regular rhythm.     Heart sounds: Normal heart sounds.  Pulmonary:     Effort: Pulmonary effort is normal.     Breath sounds: Normal breath sounds.  Skin:    General: Skin is warm and dry.  Neurological:     Mental Status: He is alert and oriented to person, place, and time.  Psychiatric:        Attention and Perception: Attention and perception normal.        Mood and Affect: Mood and affect normal.        Speech: Speech normal.        Behavior: Behavior normal. Behavior is cooperative.        Thought Content: Thought content normal.        Cognition and Memory: Cognition and memory normal.        Judgment: Judgment normal.    Review of Systems  Constitutional: Negative.   HENT: Negative.    Eyes: Negative.   Respiratory: Negative.    Cardiovascular: Negative.   Gastrointestinal: Negative.   Genitourinary: Negative.   Musculoskeletal: Negative.   Skin: Negative.   Neurological:  Positive for  dizziness.       Dizziness, lessening x 48 hours  Psychiatric/Behavioral:  Positive for substance abuse.    Blood pressure 111/73, pulse 72, temperature 98.4 F (36.9 C), temperature source Oral, resp. rate 18, SpO2 98%. There is no height or weight on file to calculate BMI.  Treatment Plan Summary: Daily contact with patient to assess and evaluate symptoms and progress in treatment  Labs Reviewed: No new lab orders placed.  Vital signs: WNL.  Medication management: New order placed, encourage PO fluid intake.  Patient will be offered Gatorade.  Continue current medications: -Acetaminophen  650 mg every 6 hours, as needed/mild pain -Maalox 30 mL oral  every 4 hours, as needed/digestion -Hydroxyzine  25 mg 3 times daily as needed/anxiety -Magnesium  hydroxide 30 mL daily as needed/mild constipation -sulfamethoxazole -trimethoprim  (BACTRIM  DS) 800-160 MG per tablet 1 tablet x 7 days total -Trazodone  50 mg nightly, as needed/sleep     Agitation protocol: MILD -Haloperidol  5 mg 3 times daily as needed mild agitation  -Diphenhydramine  50 mg p.o. 3 times daily as needed mild agitation   MODERATE -Haloperidol  5 mg IM 3 times daily as needed/moderate agitation -Diphenhydramine  50 mg IM 3 times daily as needed/moderate agitation -Lorazepam  2 mg IM 3 times daily as needed/moderate agitation   SEVERE -Haloperidol  10 mg IM 3 times daily as needed severe agitation -Diphenhydramine  50 mg IM 3 times daily as needed/severe agitation -Lorazepam  2 mg IM 3 times daily as needed/severe agitation    Discharge Planning: Disposition/case management to assist with discharge planning and identification of follow-up needs including residential or outpatient substance use treatment options as needed, prior to discharge.  Patient would like to be referred to [facility] in [place] .  Patient encouraged to look into rebound program by the foster parents who are currently caring for his children. Diontay is currently linked with [facility] and has requested sponsorship just prior to admission. Discharge Concerns: Need to confirm safety plan; Medication compliance and effectiveness. Discharge Goals: Return home with outpatient referrals for mental health follow-up including psychotherapy and substance use treatment follow-up resources as appropriate  Ellouise LITTIE Dawn, FNP 01/23/2024 10:32 AM

## 2024-01-23 NOTE — Group Note (Signed)
 Group Topic: Recovery Basics  Group Date: 01/23/2024 Start Time: 1300 End Time: 1345 Facilitators: Alyse Leilani LABOR, NT  Department: [facility]  Number of Participants: 8  Group Focus: relapse prevention, self-awareness, self-esteem, and social skills Treatment Modality:  Patient-Centered Therapy Interventions utilized were clarification, patient education, and problem solving Purpose: enhance coping skills, express feelings, increase insight, regain self-worth, and reinforce self-care  Name: olita LULLA Molt Date of Birth: [birth year]  MR: [id number]    Pt did not attend group.  Patients Problems:  Patient Active Problem List   Diagnosis Date Noted   Cocaine abuse (HCC) 01/22/2024

## 2024-01-24 DIAGNOSIS — Z79899 Other long term (current) drug therapy: Secondary | ICD-10-CM | POA: Diagnosis not present

## 2024-01-24 DIAGNOSIS — F1410 Cocaine abuse, uncomplicated: Secondary | ICD-10-CM | POA: Diagnosis not present

## 2024-01-24 DIAGNOSIS — R45851 Suicidal ideations: Secondary | ICD-10-CM | POA: Diagnosis not present

## 2024-01-24 NOTE — Group Note (Signed)
 Group Topic: Recovery Basics  Group Date: 01/24/2024 Start Time: 0930 End Time: 1000 Facilitators: Belle Camellia SAILOR, RN  Department: [facility]  Number of Participants: 8 Group Focus: acceptance and relapse prevention Treatment Modality:  Solution-Focused Therapy Interventions utilized were patient education Purpose: increase insight and relapse prevention strategies  Name: olita LULLA Molt Date of Birth: 10-Jun-1980  MR: [id number]    Level of Participation: moderate Quality of Participation: attentive and supportive Interactions with others: gave feedback Mood/Affect: appropriate  Cognition: logical Progress: Moderate Plan  follow-up needed  Patients Problems:  Patient Active Problem List   Diagnosis Date Noted   Cocaine abuse (HCC) 01/22/2024

## 2024-01-24 NOTE — Care Management (Addendum)
 [facility] Care Management...  Addendum 2:53 pm  Patient requested discharge.   Patient spoke with mother and she will cover the patients stay at hotel close to his job. Per mother patient is required to attend meetings (AA/NA) and after the appropriate amount of time (two weeks, house rules) move back into [employer].     Writer met with patient to discuss discharge planning.  Patient will discharge Tuesday 01/25/2024 to...   [address]  [place], [place]  RN to arrange transportation  7 day samples and scripts   Addendum 1:42 pm Collateral ..SABRAWriter received return call from patient's mother Colin Davidson. Writer informed Colin Davidson of patients plan to discharge and live in hotel. Colin Davidson reported that the plan was set in stone and that she was a little bit uncomfortable with patient living in hotel. Colin Davidson reported that to her knowledge patient became involved with a male and things started regressing. Colin Davidson reported that patient does have a breaking and entering charge, loss of kids due to drug use. Colin Davidson reported that is willing to help patient with stipulations. I advised Colin Davidson that I would have patient call her to confirm plans for discharge  Writer will follow-up with patient           Addendum 11:55  Writer reached out to [facility] [phone]. No answer  Writer left HIPAA compliant message for return call  Addendum  11:30  Writer informed patient that he can call [facility] to complete phone screening  Patient reported that he is no longer sure of going inpatient.  Patient reported speaking to mom and she would house him in a Extended [address] as long as the patient gets a job and and some kind of treatment.  Patient provided Writer with mother's number Colin Davidson @ [phone]  Patient gave verbal consent for writer to speak with Colin Davidson     Patient reported that he    Writer met with patient to discuss discharge planning.  Patient reported looking for inpatient treatment and  provided information for [facility]   Writer faxed referral to [facility] and [facility]

## 2024-01-24 NOTE — Group Note (Signed)
 Group Topic: Change and Accountability  Group Date: 01/24/2024 Start Time: 1100 End Time: 1200 Facilitators: Kalima Saylor, LCSW  Department: [facility]  Number of Participants: 7  Group Focus: chemical dependency education, communication, coping skills, forgiveness, relapse prevention, self-awareness, and substance abuse education Treatment Modality:  Cognitive Behavioral Therapy, Psychoeducation, and Solution-Focused Therapy Interventions utilized were confrontation, exploration, problem solving, and support Purpose: enhance coping skills, explore maladaptive thinking, express feelings, express irrational fears, regain self-worth, reinforce self-care, and trigger / craving management  Writer explored with the group triggers related to coping with substances  and accountability.  Writer utilized Art therapy and CBT to explore triggers, forgiveness, and accountability.     Group members provided positive supportive feedback and expressed motivation for change.  Writer challenged each group members accountability of what lead them to this detox facility and if they want to change, what does that look like.   Name: Colin Davidson Date of Birth: 1980/04/18  MR: [id number]    Level of Participation: minimal Quality of Participation: offered feedback and Client would initiate conversations when asked.  Interactions with others: gave feedback Mood/Affect: brightens with interaction and closed / guarded Triggers (if applicable): holidays and being around other known users.  Cognition: logical Progress: Minimal Response: Client was engaged when asked questions directly. Plan: follow-up as needed  Patients Problems:  Patient Active Problem List   Diagnosis Date Noted   Cocaine abuse (HCC) 01/22/2024

## 2024-01-24 NOTE — Care Management (Signed)
 [facility] Care Management...  Writer met with patient to discuss discharge planning.  Patient reported looking for inpatient treatment. Patient reported speaking with Deward at [facility] in [place] [phone] Fax  ([phone].   Writer sent referrals to [facility] and [facility]

## 2024-01-24 NOTE — Group Note (Signed)
 Group Topic: Positive Affirmations  Group Date: 01/24/2024 Start Time: 1330 End Time: 1400 Facilitators: Laneta Renea POUR, NT  Department: [facility]  Number of Participants: 5  Group Focus: check in, communication, and coping skills Treatment Modality:  Psychoeducation Interventions utilized were patient education Purpose: enhance coping skills  Name: Colin Davidson Date of Birth: 11/27/1980  MR: [id number]    Level of Participation: active Quality of Participation: cooperative and engaged Interactions with others: gave feedback Mood/Affect: appropriate and positive Triggers (if applicable): none Cognition: coherent/clear and insightful Progress: Gaining insight Response: I am just grateful .  Plan: patient will be encouraged to attend group.   Patients Problems:  Patient Active Problem List   Diagnosis Date Noted   Cocaine abuse (HCC) 01/22/2024

## 2024-01-24 NOTE — ED Notes (Signed)
 Pt. Received seated in the community room with peers in no apparent distress. Answers short questions appropriately.  No complaints . Safety maintained.

## 2024-01-24 NOTE — Group Note (Signed)
 Group Topic: Communication  Group Date: 01/24/2024 Start Time: 2000 End Time: 2030 Facilitators: Verdon Jacqualyn BRAVO, NT  Department: [facility]  Number of Participants: 9  Group Focus: communication Treatment Modality:  Individual Therapy Interventions utilized were group exercise Purpose: improve communication skills  Name: Colin Davidson Date of Birth: 09-19-1980  MR: [id number]    Level of Participation: active Quality of Participation: cooperative Interactions with others: gave feedback Mood/Affect: appropriate Triggers (if applicable): n/a Cognition: coherent/clear Progress: Moderate Response: n/a Plan: follow-up needed  Patients Problems:  Patient Active Problem List   Diagnosis Date Noted   Cocaine abuse (HCC) 01/22/2024

## 2024-01-24 NOTE — Progress Notes (Signed)
 Pt is awake and alert. Sitting up in the dayroom eating breakfast.  He is maintaining appropriate boundaries with staff and peers.  No distress noted.

## 2024-01-24 NOTE — ED Provider Notes (Cosign Needed)
 Behavioral Health Progress Note  Date and Time: 01/24/2024 11:45 AM Name: Colin Davidson MRN:  [id number]  HPI: Colin Davidson presented to [facility] emergency department on 01/21/2024, he is a 43 year old male.  Upon arrival to emergency department patient endorsed suicidal ideation and infection to left hand.  Infection stems from patient's IVDU, cocaine.  ABX initiated, admission to facility based crisis at [facility] on 01/22/2024, patient voluntary.   Per initial admission assessment:  01/22/2024-0445am Patient is a 43 year old male with hx of polysubstance abuse. patient was received from the Emergency Department to [facility] in stable condition. Patient endorses a long history of substance abuse, mainly cocaine, with recent IV use; says he has use about 12 grams in the last 3-4 day, last use was yesterday. He reports injecting at multiple sites on bilateral arms and has an infection of the left hand, without associated fever or systemic symptoms. Patient states motivation to quit and participate in substance abuse treatment. He reports being 6 months sober from alcohol use. He is alert and oriented x4. Speech is clear, normal in tone and rate. Mood is euthymic, affect is congruent with mood. No evidence of delusional thought content. He is endorsing passive suicidal ideation without plan or intent. Denies homicidal ideation, auditory or visual hallucinations, paranoia, or other psychotic symptoms.    Patient reassessment, chart reviewed and patient discussed with attending psychiatrist, Dr Cole on 01/24/2024: Colin Davidson is reassessed by this nurse practitioner face-to-face.  He is seated, no apparent distress.  He denies pain, denies physical concerns.  Patient reports recent diagnosed left hand cellulitis not painful.  He remains compliant with ABX. Patient states I feel good today. Patient is alert and oriented, pleasant and cooperative during assessment.  Patient presents with  euthymic mood, congruent affect.  Patient is positive and engaged in groups.  He attends meals with peers in day room. [name] endorses average sleep and appetite. He is compliant with medications.  Behavior appropriate with staff and peers.  Patient continues to deny SI/HI/AVH.  There is no evidence of delusional thought content no indication that patient is responding to internal stimuli.  Colin Davidson reports he would like to return to outpatient substance use treatment at [facility].  He approached a sponsor through his AA group just prior to current admission.  Plans to resume previous outpatient substance use treatment plan.  Patient reports he would like to return to work.  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  Patient offered support and encouragement.  He verbalizes understanding of treatment plan.   Diagnosis:  Final diagnoses:  Cocaine use disorder (HCC)    Total Time spent with patient: 20 minutes  Past Psychiatric History: Cocaine use disorder, substance-induced mood disorder Past Medical History: Chest pain syndrome, inguinal hernia, palpitations, peptic ulcer disease, cellulitis Family History: None reported Family Psychiatric  History: None reported Social History: Resides in [place], denies access to weapons, endorses cocaine use x 3 days, history alcohol use   Additional Social History:    Sleep: Good  Appetite:  Good  Current Medications:  Current Facility-Administered Medications  Medication Dose Route Frequency Provider Last Rate Last Admin   acetaminophen  (TYLENOL ) tablet 650 mg  650 mg Oral Q6H PRN Ajibola, Ene A, NP   650 mg at 01/23/24 0927   alum & mag hydroxide-simeth (MAALOX/MYLANTA) 200-200-20 MG/5ML suspension 30 mL  30 mL Oral Q4H PRN Ajibola,  Ene A, NP       haloperidol  (HALDOL ) tablet 5 mg  5 mg Oral TID PRN Ajibola, Ene A, NP       And   diphenhydrAMINE   (BENADRYL ) capsule 50 mg  50 mg Oral TID PRN Ajibola, Ene A, NP       haloperidol  lactate (HALDOL ) injection 5 mg  5 mg Intramuscular TID PRN Ajibola, Ene A, NP       And   diphenhydrAMINE  (BENADRYL ) injection 50 mg  50 mg Intramuscular TID PRN Ajibola, Ene A, NP       And   LORazepam  (ATIVAN ) injection 2 mg  2 mg Intramuscular TID PRN Ajibola, Ene A, NP       haloperidol  lactate (HALDOL ) injection 10 mg  10 mg Intramuscular TID PRN Ajibola, Ene A, NP       And   diphenhydrAMINE  (BENADRYL ) injection 50 mg  50 mg Intramuscular TID PRN Ajibola, Ene A, NP       And   LORazepam  (ATIVAN ) injection 2 mg  2 mg Intramuscular TID PRN Ajibola, Ene A, NP       hydrOXYzine  (ATARAX ) tablet 25 mg  25 mg Oral TID PRN Ajibola, Ene A, NP       magnesium  hydroxide (MILK OF MAGNESIA) suspension 30 mL  30 mL Oral Daily PRN Ajibola, Ene A, NP       sulfamethoxazole -trimethoprim  (BACTRIM  DS) 800-160 MG per tablet 1 tablet  1 tablet Oral Q12H Gottfried, Rhoda J, MD   1 tablet at 01/24/24 9040   traZODone  (DESYREL ) tablet 50 mg  50 mg Oral QHS PRN Ajibola, Ene A, NP   50 mg at 01/23/24 2120   No current outpatient medications on file.    Labs  Lab Results:  Admission on 01/21/2024, Discharged on 01/22/2024  Component Date Value Ref Range Status   Sodium 01/21/2024 139  135 - 145 mmol/L Final   Potassium 01/21/2024 4.0  3.5 - 5.1 mmol/L Final   Chloride 01/21/2024 103  98 - 111 mmol/L Final   CO2 01/21/2024 26  22 - 32 mmol/L Final   Glucose, Bld 01/21/2024 96  70 - 99 mg/dL Final   Glucose reference range applies only to samples taken after fasting for at least 8 hours.   BUN 01/21/2024 11  6 - 20 mg/dL Final   Creatinine, Ser 01/21/2024 0.78  0.61 - 1.24 mg/dL Final   Calcium 87/87/7974 9.3  8.9 - 10.3 mg/dL Final   Total Protein 87/87/7974 7.5  6.5 - 8.1 g/dL Final   Albumin 87/87/7974 4.3  3.5 - 5.0 g/dL Final   AST 87/87/7974 38  15 - 41 U/L Final   ALT 01/21/2024 20  0 - 44 U/L Final   Alkaline  Phosphatase 01/21/2024 104  38 - 126 U/L Final   Total Bilirubin 01/21/2024 0.2  0.0 - 1.2 mg/dL Final   GFR, Estimated 01/21/2024 >60  >60 mL/min Final   Comment: (NOTE) Calculated using the CKD-EPI Creatinine Equation (2021)    Anion gap 01/21/2024 11  5 - 15 Final   Performed at [facility], [address] [place], [place] [address]   Alcohol, Ethyl (B) 01/21/2024 <15  <15 mg/dL Final   Comment: (NOTE) For medical purposes only. Performed at [facility], [address], [place], [place] [address]    WBC 01/21/2024 8.0  4.0 - 10.5 K/uL Final   RBC 01/21/2024 5.06  4.22 - 5.81 MIL/uL Final  Hemoglobin 01/21/2024 15.8  13.0 - 17.0 g/dL Final   HCT 87/87/7974 47.2  39.0 - 52.0 % Final   MCV 01/21/2024 93.3  80.0 - 100.0 fL Final   MCH 01/21/2024 31.2  26.0 - 34.0 pg Final   MCHC 01/21/2024 33.5  30.0 - 36.0 g/dL Final   RDW 87/87/7974 13.2  11.5 - 15.5 % Final   Platelets 01/21/2024 231  150 - 400 K/uL Final   nRBC 01/21/2024 0.0  0.0 - 0.2 % Final   Performed at [facility], [address] [place], [place] [address]   Opiates 01/21/2024 NEGATIVE  NEGATIVE Final   Cocaine 01/21/2024 POSITIVE (A)  NEGATIVE Final   Benzodiazepines 01/21/2024 NEGATIVE  NEGATIVE Final   Amphetamines 01/21/2024 NEGATIVE  NEGATIVE Final   Tetrahydrocannabinol 01/21/2024 NEGATIVE  NEGATIVE Final   Barbiturates 01/21/2024 NEGATIVE  NEGATIVE Final   Methadone Scn, Ur 01/21/2024 NEGATIVE  NEGATIVE Final   Fentanyl  01/21/2024 NEGATIVE  NEGATIVE Final   Comment: (NOTE) Drug screen is for Medical Purposes only. Positive results are preliminary only. If confirmation is needed, notify lab within 5 days.  Drug Class                 Cutoff (ng/mL) Amphetamine  and metabolites 1000 Barbiturate and metabolites 200 Benzodiazepine              200 Opiates and metabolites     300 Cocaine and metabolites     300 THC                         50 Fentanyl                      5 Methadone                   300  Trazodone  is metabolized in vivo to several metabolites,  including pharmacologically active m-CPP, which is excreted in the  urine.  Immunoassay screens for amphetamines and MDMA have potential  cross-reactivity with these compounds and may provide false positive  result.  Performed at [facility], [address], [place], [place] [address]    Salicylate Lvl 01/21/2024 <7.0 (L)  7.0 - 30.0 mg/dL Final   Performed at [facility], [address], [place], [place] [address]   Acetaminophen  (Tylenol ), Serum 01/21/2024 <10 (L)  10 - 30 ug/mL Final   Comment: (NOTE) Toxic concentrations can be more effectively related to post dose interval; >200, >100, and >50 ug/mL serum concentrations correspond to toxic concentrations at 4, 8, and 12 hours post dose, respectively.  Performed at [facility], [address], [place], [place] [address]     Blood Alcohol level:  Lab Results  Component Value Date   [facility] <15 01/21/2024   [facility]  08/07/2009    <5        LOWEST DETECTABLE LIMIT FOR SERUM ALCOHOL IS 5 mg/dL FOR MEDICAL PURPOSES ONLY    Metabolic Disorder Labs: No results found for: HGBA1C, MPG No results found for: PROLACTIN No results found for: CHOL, TRIG, HDL, CHOLHDL, VLDL, LDLCALC  Therapeutic Lab Levels: No results found for: LITHIUM No results found for: VALPROATE No results found for: CBMZ  Physical Findings   PHQ2-9    Flowsheet Row ED from 01/22/2024 in [facility]  PHQ-2 Total Score 0   Flowsheet Row ED [facility]/22/2024 in [facility] ED from 01/21/2024 in [facility] Emergency Department at [facility]  C-SSRS RISK CATEGORY High Risk High Risk     Musculoskeletal  Strength & Muscle Tone: within normal limits Gait & Station: normal Patient leans:  N/A  Psychiatric Specialty Exam  Presentation  General Appearance:  Appropriate for Environment; Casual  Eye Contact: Good  Speech: Clear and Coherent; Normal Rate  Speech Volume: Normal  Handedness: Right   Mood and Affect  Mood: Euthymic  Affect: Appropriate; Congruent   Thought Process  Thought Processes: Coherent; Goal Directed; Linear  Descriptions of Associations:Intact  Orientation:Full (Time, Place and Person)  Thought Content:Logical; WDL  Diagnosis of Schizophrenia or Schizoaffective disorder in past: No    Hallucinations:Hallucinations: None  Ideas of Reference:None  Suicidal Thoughts:Suicidal Thoughts: No  Homicidal Thoughts:Homicidal Thoughts: No   Sensorium  Memory: Immediate Good; Recent Good  Judgment: Good  Insight: Good   Executive Functions  Concentration: Good  Attention Span: Good  Recall: Good  Fund of Knowledge: Good  Language: Good   Psychomotor Activity  Psychomotor Activity: Psychomotor Activity: Normal   Assets  Assets: Communication Skills; Desire for Improvement; Financial Resources/Insurance; Vocational/Educational   Sleep  Sleep: Sleep: Good  Estimated Sleeping Duration (Last 24 Hours): 12.25-13.75 hours  No data recorded  Physical Exam  Physical Exam Vitals and nursing note reviewed.  Constitutional:      Appearance: Normal appearance. He is well-developed.  HENT:     Head: Normocephalic and atraumatic.     Nose: Nose normal.  Cardiovascular:     Rate and Rhythm: Normal rate.  Pulmonary:     Effort: Pulmonary effort is normal.  Musculoskeletal:        General: Normal range of motion.     Cervical back: Normal range of motion.  Skin:    General: Skin is warm and dry.  Neurological:     Mental Status: He is alert and oriented to person, place, and time.  Psychiatric:        Attention and Perception: Attention and perception normal.        Mood and Affect: Mood and affect  normal.        Speech: Speech normal.        Behavior: Behavior normal. Behavior is cooperative.        Thought Content: Thought content normal.        Cognition and Memory: Cognition and memory normal.    Review of Systems  Constitutional: Negative.   HENT: Negative.    Eyes: Negative.   Respiratory: Negative.    Cardiovascular: Negative.   Gastrointestinal: Negative.   Genitourinary: Negative.   Musculoskeletal: Negative.   Skin: Negative.   Neurological: Negative.   Psychiatric/Behavioral:  Positive for substance abuse.    Blood pressure 107/70, pulse 68, temperature 98.4 F (36.9 C), resp. rate 17, SpO2 99%. There is no height or weight on file to calculate BMI.  Treatment Plan Summary: Daily contact with patient to assess and evaluate symptoms and progress in treatment  Continue current medications: -Acetaminophen  650 mg every 6 hours, as needed/mild pain -Maalox 30 mL oral every 4 hours, as needed/digestion -Hydroxyzine  25 mg 3 times daily as needed/anxiety -Magnesium  hydroxide 30 mL daily as needed/mild constipation -sulfamethoxazole -trimethoprim  (BACTRIM  DS) 800-160 MG per tablet 1 tablet x 7 days total -Trazodone  50 mg nightly, as needed/sleep     Agitation protocol: MILD -Haloperidol  5 mg 3 times daily as needed mild agitation  -Diphenhydramine  50 mg p.o. 3 times daily as  needed mild agitation   MODERATE -Haloperidol  5 mg IM 3 times daily as needed/moderate agitation -Diphenhydramine  50 mg IM 3 times daily as needed/moderate agitation -Lorazepam  2 mg IM 3 times daily as needed/moderate agitation   SEVERE -Haloperidol  10 mg IM 3 times daily as needed severe agitation -Diphenhydramine  50 mg IM 3 times daily as needed/severe agitation -Lorazepam  2 mg IM 3 times daily as needed/severe agitation     Discharge Planning: Disposition/case management to assist with discharge planning and identification of follow-up needs including residential or outpatient  substance use treatment options as needed, prior to discharge.  Patient requested residential substance use tx upon arrival. Today he is considering return to outpatient tx with AA sponsorship (pending), so that he can continue to work. Plans to confirm decision after speaking with his mother and his employer.  Likely discharge on 01/25/2024. Discharge Concerns: Need to confirm safety plan; Medication compliance and effectiveness. Discharge Goals: Return home with outpatient referrals for mental health follow-up including psychotherapy and substance use treatment follow-up resources as appropriate Ellouise LITTIE Dawn, FNP 01/24/2024 11:45 AM

## 2024-01-24 NOTE — ED Notes (Signed)
 Pt sleeping in bed, no acute distress noted. Respirations even and unlabored. Continue to monitor for safety.

## 2024-01-25 DIAGNOSIS — F1410 Cocaine abuse, uncomplicated: Secondary | ICD-10-CM | POA: Diagnosis not present

## 2024-01-25 DIAGNOSIS — Z79899 Other long term (current) drug therapy: Secondary | ICD-10-CM | POA: Diagnosis not present

## 2024-01-25 DIAGNOSIS — R45851 Suicidal ideations: Secondary | ICD-10-CM | POA: Diagnosis not present

## 2024-01-25 MED ORDER — SULFAMETHOXAZOLE-TRIMETHOPRIM 800-160 MG PO TABS: 1.0000 | ORAL_TABLET | Freq: Two times a day (BID) | ORAL | Status: AC

## 2024-01-25 MED ORDER — TRAZODONE HCL 50 MG PO TABS: 50.0000 mg | ORAL_TABLET | Freq: Every evening | ORAL | 0 refills | Status: AC | PRN

## 2024-01-25 NOTE — ED Notes (Signed)
 Pt compliant with medications, resting quietly, denies thoughts for SI or self harm when interviewed.  Safety maintained

## 2024-01-25 NOTE — Discharge Instructions (Addendum)
 [facility] Care Management:  Patient will discharge Tuesday 01/25/2024 to:  [address]  [place], [place]  Patient will reconnect with [facility] to resume SA-IOP  [facility] [address] [place], [place], [place] [address] Phone: [phone]    RN to arrange transportation   Prescriptions sent electronically to [facility] pharmacy located on [facility] [place], [place]. 8 day samples provided for his antibiotic, bactrim  for cellulitis to left hand. Patient to follow up with their PCP for ongoing management due to current treatment of cellulitis to left hand with Bactrim .   Discharge recommendations:   Medications: Patient is to take medications as prescribed. The patient or patient's guardian is to contact a medical professional and/or outpatient provider to address any new side effects that develop. The patient or the patient's guardian should update outpatient providers of any new medications and/or medication changes.  Outpatient Follow up: Please review list of outpatient resources for psychiatry and counseling. Please follow up with your primary care provider for all medical related needs.   Therapy: We recommend that patient participate in individual therapy to address mental health concerns.  Safety:   The following safety precautions should be taken:   No sharp objects. This includes scissors, razors, scrapers, and putty knives.   Chemicals should be removed and locked up.   Medications should be removed and locked up.   Weapons should be removed and locked up. This includes firearms, knives and instruments that can be used to cause injury.   The patient should abstain from use of illicit substances/drugs and abuse of any medications.  If symptoms worsen or do not continue to improve or if the patient becomes actively suicidal or homicidal then it is recommended that the patient return to the closest hospital emergency department, the [facility], or call 911 for further evaluation and treatment. National Suicide Prevention Lifeline 1-800-SUICIDE or 862-845-1284  [facility]: open 24 hours [address] [place], [place] [phone]  About [phone] offers 24/7 access to trained crisis counselors who can help people experiencing mental health-related distress. People can call or text 988 or chat 988lifeline.org for themselves or if they are worried about a loved one who may need crisis support.     SUBSTANCE USE TREATMENT for Medicaid and State Funded/IPRS  [facility] [address], [place], [address] [phone] phone NOTE: [facility] is no longer offering IOP services.  Serves those who are low-income or have no insurance.  [facility] [address] [place], [place], [address] [phone] phone [phone] fax NOTE: Does have Substance Abuse-Intensive Outpatient Program [facility]) as well as transitional housing if eligible.  [facility] [address] [place], [place], [address] [phone] phone [phone] fax  [facility] [phone] [place], [place], [address] [phone] phone [phone] fax  HALFWAY HOUSES:  [facility] [phone]  [employer].oxfordvacancies.com  12 STEP PROGRAMS:  Alcoholics Anonymous of [place] softwarechalet.be  Narcotics Anonymous of [place] hitprotect.dk  Al-Anon of [place], [place] www.[place]alanon.org/find-meetings.html  Nar-Anon https://nar-anon.org/find-a-meetin   List of Residential placements:   [facility] in [place]: [phone]  [facility]: [phone]  [place], [place] [phone]: Male and male facility; 30-day program: (uninsured and Medicaid such as [place], [place], [place], partners)  [facility]:  [phone]; men and women's facility; 28 days; Can have Medicaid tailored plan Tour Manager or Partners)  [facility]: [phone] Ang[place]  or [place]; 28 day program; must be fully detox; tailored Medicaid or no insurance  [address] in [place], [place]; [phone]; 28 day all males program; no insurance accepted  [facility] Referral in [place]: [place] [phone] (no insurance or Medicaid only); 90 days; outpatient services but provide housing in apartments [place]  [facility] Admission: [phone]: Patient must complete phone screening for placement: [place], [place]; 6 month program; uninsured, Medicaid, and Western [employer] southern financial.   [facility]: no insurance required; [phone]  [facility]: [phone]; Intake: [place]; Must fill out application online; Steffan Delay [phone] x [address] [place] in [place], [place]: [phone]; Admissions Coordinators Mr. Marinda or Alm Eagles; 90 day program.  [facility]: [place], [place] [phone]; Co-Ed 9 month to a year program; Online application; Men entry fee is $500 (6-30months);  [employer]: [address] [place], [place] [address]; no fee or insurance required; minimum of 2 years; Highly structured; work based; Intake Coordinator is [place] [phone]  [facility] in [place], [place]: [phone]; Fax number is [phone]; website: www.[facility].org; Requires 3-6 page autobiography; 2 year program (18 months and then 45month transitional housing); Admission fee is $300; no insurance needed; work Automotive Engineer in [place], [place]: [employer] Desk Staff: [place] [phone]: They have a Men's Regenerations Program 6-65months. Free program; There is an initial $300 fee however, they are willing to work with patients regarding that. Application is online.  [facility]: Admissions [phone] [place] Free ext [phone]; Any 7-90 day program is out of pocket; 12  month program is free of charge; there is a $275 entry fee; Patient is responsible for own transportation

## 2024-01-25 NOTE — Care Management (Addendum)
 [facility] Care Management...  Patient will discharge Tuesday 01/25/2024 to...    [address]  [place], [place]  Patient will reconnect with [facility] to resume SA-IOP  [facility] [address] [place], [place], [place] [address] Phone: [phone]    RN to arrange transportation   7 day samples and Press Photographer provided letter for patient to return to work

## 2024-01-25 NOTE — ED Provider Notes (Signed)
 FBC/OBS ASAP Discharge Summary  Date and Time: 01/25/2024 9:28 AM  Name: Colin Davidson  MRN:  [id number]   Discharge Diagnoses:  Final diagnoses:  Cocaine use disorder (HCC)  Insomnia due to mental condition   Subjective: Colin Davidson is seen face to face by this provider and chart reviewed on 01/25/2024. On evaluation, patient reports that he slept good and like a rock. He reports that his trazodone  has been very effective in helping him remain asleep throughout the night, which has improved from previously constantly tossing and turning in his sleep. He is alert and oriented x 4. Calm and cooperative during his assessment. His mood is nervous and his affect is congruent with his affect. He describes being mildly anxious due to problems he has to face upon discharge like getting his car fixed because it has been dumping oil and then making it to work to have a conversation with his boss at [employer]. He reports being excited as well. Reports that his appetite is healthy. Describes energy as moderate. His plan is to resist illicit drug use by attending his AA meetings (sponsor [place]) and attending individual sessions at the [facility] for substance abuse intensive outpatient treatment because he has relapsed 3 times while attending IOP in a group setting due to influence from other individuals using while in the program. His plan is to attend IOP 3 nights a week, 3 hours each. After attending SA-IOP for at-least 2 weeks he reports that he'll be able to return back to [facility]). Denies depression and rates anxiety a 1 on a scale of 0-10 (10 being the worst). Coping skills include following Christ by reading the bible. His ultimate goal is cessation of drug use/maintaining sobriety from alcohol to one day regain custody of his children, which he reports he lost due to his drug use. They are currently in foster care in a good home per patient. Patient  has been compliant with his ABT (bactrim ) for cellulitis to his left hand. His left hand no longer has any signs of cellulitis, no swelling, drainage, pain, redness, or signs of infection. Patient has been provided a 8 day supply of bactrim  to complete his course of treatment for cellulitis to his left hand. Discussed importance of completing antibiotic course with patient who demonstrates understanding. Upon request, his trazodone  was sent electronically to his pharmacy. Patient was provided a taxi voucher to get to his car in [place], [place] and he reports that his mother has arranged a hotel for him to temporarily stay in while he completes his SA-IOP program and works until he is able to return to [employer].   His speech is clear, coherent, and logical. Objectively, there is no evidence of psychosis, mania, or delusional thinking. He doesn't appear to be responding to internal or external stimuli. He is able to converse coherently with goal-directed thoughts, no distractibility, or pre-occupation. He denies suicidal and homicidal ideations. He denies auditory and visual hallucinations. He denies paranoia. He answered his questions appropriately.   Patient has been appropriate in the milieu. He was evaluated each day by a clinical provider to ascertain response to treatment. Improvement was noted by patient's report of decreasing symptoms, improving sleep and appetite, affect, medication tolerance without side effects, behavior, and participation in unit programming. Patient denies any withdrawal symptoms related to cocaine use. He reports perceived benefit from his [facility] stay.   Labs were reviewed from 01/21/2024: CMP within normal ranges. CBC  within normal ranges. Glucose within normal range. Tylenol  and salicylate levels normal. UDS from 01/21/2024 positive for cocaine. EKG from 01/22/2024: sinus bradycardia with ventricular rate of 59 beats per minute and QT/QTcB of 421.   The patient is able to verbalize  their individual safety plan to this provider. It is recommended to the patient to continue psychiatric medications as prescribed, after discharge from the hospital. It is recommended to the patient to follow up with your outpatient psychiatric provider ([facility]) and PCP.   It was discussed with the patient, the impact of alcohol, drugs, tobacco have been there overall psychiatric and medical wellbeing, and total abstinence from substance use was recommended the patient.  Patient is agreeable to plan. Given opportunity to ask questions. He reports feeling comfortable with discharge. In the event of worsening symptoms, the patient is instructed to call the crisis hotline, 911 and or go to the nearest ED for appropriate evaluation and treatment of symptoms. He can also return to [facility] as we're open 24 hours a day.  Stay Summary: Admitted on 01/22/2024: Note by Kathryne LABOR., NP- Patient is a 43 year old male with hx of polysubstance abuse. patient was received from the Emergency Department to [facility] in stable condition. Patient endorses a long history of substance abuse, mainly cocaine, with recent IV use; says he has use about 12 grams in the last 3-4 day, last use was yesterday. He reports injecting at multiple sites on bilateral arms and has an infection of the left hand, without associated fever or systemic symptoms. Patient states motivation to quit and participate in substance abuse treatment. He reports being 6 months sober from alcohol use. He is alert and oriented x4. Speech is clear, normal in tone and rate. Mood is euthymic, affect is congruent with mood. No evidence of delusional thought content. He is endorsing passive suicidal ideation without plan or intent. Denies homicidal ideation, auditory or visual hallucinations, paranoia, or other psychotic symptoms. Medications that were started: -Acetaminophen  650 mg every 6 hours, as needed/mild pain -Maalox 30 mL oral every 4 hours, as  needed/digestion -Hydroxyzine  25 mg 3 times daily as needed/anxiety -Magnesium  hydroxide 30 mL daily as needed/mild constipation -sulfamethoxazole -trimethoprim  (BACTRIM  DS) 800-160 MG per tablet 1 tablet x 7 days total -Trazodone  50 mg nightly, as needed/sleep  Patient remained actively engaged in the milieu during his stay. He attended groups and meals in the common area. He exhibited appropriate behavior with staff and peers. He tolerated his medications well without any adverse reactions.   Total Time spent with patient: 20 minutes  Past Psychiatric History: cocaine use disorder, substance-induced mood disorder Past Medical History: cellulitis to left hand due to IV drug use Past Medical History:  Diagnosis Date   Chest pain syndrome    Drug abuse, cocaine type (HCC)    Inguinal hernia    Palpitations    PUD (peptic ulcer disease)    Family History: None reported Family Psychiatric History: None reported Social History: Resides in [place]. Denies access to weapons. Endorses cocaine use x 3 days upon admission. History of alcohol use. Tobacco Cessation:  N/A, patient does not currently use tobacco products  Current Medications:  Current Facility-Administered Medications  Medication Dose Route Frequency Provider Last Rate Last Admin   acetaminophen  (TYLENOL ) tablet 650 mg  650 mg Oral Q6H PRN Ajibola, Ene A, NP   650 mg at 01/23/24 0927   alum & mag hydroxide-simeth (MAALOX/MYLANTA) 200-200-20 MG/5ML suspension 30 mL  30 mL Oral Q4H PRN Ajibola,  Ene A, NP       haloperidol  (HALDOL ) tablet 5 mg  5 mg Oral TID PRN Ajibola, Ene A, NP       And   diphenhydrAMINE  (BENADRYL ) capsule 50 mg  50 mg Oral TID PRN Ajibola, Ene A, NP       haloperidol  lactate (HALDOL ) injection 5 mg  5 mg Intramuscular TID PRN Ajibola, Ene A, NP       And   diphenhydrAMINE  (BENADRYL ) injection 50 mg  50 mg Intramuscular TID PRN Ajibola, Ene A, NP       And   LORazepam  (ATIVAN ) injection 2 mg  2 mg  Intramuscular TID PRN Ajibola, Ene A, NP       haloperidol  lactate (HALDOL ) injection 10 mg  10 mg Intramuscular TID PRN Ajibola, Ene A, NP       And   diphenhydrAMINE  (BENADRYL ) injection 50 mg  50 mg Intramuscular TID PRN Ajibola, Ene A, NP       And   LORazepam  (ATIVAN ) injection 2 mg  2 mg Intramuscular TID PRN Ajibola, Ene A, NP       hydrOXYzine  (ATARAX ) tablet 25 mg  25 mg Oral TID PRN Ajibola, Ene A, NP       magnesium  hydroxide (MILK OF MAGNESIA) suspension 30 mL  30 mL Oral Daily PRN Ajibola, Ene A, NP       sulfamethoxazole -trimethoprim  (BACTRIM  DS) 800-160 MG per tablet 1 tablet  1 tablet Oral Q12H Gottfried, Rhoda J, MD   1 tablet at 01/25/24 9076   traZODone  (DESYREL ) tablet 50 mg  50 mg Oral QHS PRN Ajibola, Ene A, NP   50 mg at 01/24/24 2159   Current Outpatient Medications  Medication Sig Dispense Refill   sulfamethoxazole -trimethoprim  (BACTRIM  DS) 800-160 MG tablet Take 1 tablet by mouth every 12 (twelve) hours for 3 days.     traZODone  (DESYREL ) 50 MG tablet Take 1 tablet (50 mg total) by mouth at bedtime as needed for sleep. 30 tablet 0    PTA Medications:  Facility Ordered Medications  Medication   acetaminophen  (TYLENOL ) tablet 650 mg   alum & mag hydroxide-simeth (MAALOX/MYLANTA) 200-200-20 MG/5ML suspension 30 mL   magnesium  hydroxide (MILK OF MAGNESIA) suspension 30 mL   haloperidol  (HALDOL ) tablet 5 mg   And   diphenhydrAMINE  (BENADRYL ) capsule 50 mg   haloperidol  lactate (HALDOL ) injection 5 mg   And   diphenhydrAMINE  (BENADRYL ) injection 50 mg   And   LORazepam  (ATIVAN ) injection 2 mg   haloperidol  lactate (HALDOL ) injection 10 mg   And   diphenhydrAMINE  (BENADRYL ) injection 50 mg   And   LORazepam  (ATIVAN ) injection 2 mg   hydrOXYzine  (ATARAX ) tablet 25 mg   traZODone  (DESYREL ) tablet 50 mg   sulfamethoxazole -trimethoprim  (BACTRIM  DS) 800-160 MG per tablet 1 tablet   PTA Medications  Medication Sig   traZODone  (DESYREL ) 50 MG tablet Take 1 tablet  (50 mg total) by mouth at bedtime as needed for sleep.   sulfamethoxazole -trimethoprim  (BACTRIM  DS) 800-160 MG tablet Take 1 tablet by mouth every 12 (twelve) hours for 3 days.       01/25/2024    8:56 AM 01/22/2024    3:16 PM  Depression screen PHQ 2/9  Decreased Interest 0 0  Down, Depressed, Hopeless 0 0  PHQ - 2 Score 0 0  Altered sleeping 0   Tired, decreased energy 0   Change in appetite 1   Feeling bad or failure about yourself  0  Trouble concentrating 0   Moving slowly or fidgety/restless 1   Suicidal thoughts 0   PHQ-9 Score 2     Flowsheet Row ED from 01/22/2024 in [facility] ED from 01/21/2024 in [facility] at [facility]   C-SSRS RISK CATEGORY High Risk High Risk    Musculoskeletal  Strength & Muscle Tone: within normal limits Gait & Station: normal Patient leans: N/A  Psychiatric Specialty Exam  Presentation  General Appearance:  Appropriate for Environment; Casual  Eye Contact: Good  Speech: Clear and Coherent; Normal Rate  Speech Volume: Normal  Handedness: Right   Mood and Affect  Mood: Anxious  Affect: Congruent; Appropriate   Thought Process  Thought Processes: Coherent; Goal Directed  Descriptions of Associations:Intact  Orientation:Full (Time, Place and Person)  Thought Content:Logical; WDL  Diagnosis of Schizophrenia or Schizoaffective disorder in past: No    Hallucinations:Hallucinations: None  Ideas of Reference:None  Suicidal Thoughts:Suicidal Thoughts: No  Homicidal Thoughts:Homicidal Thoughts: No   Sensorium  Memory: Immediate Good; Recent Good  Judgment: Good  Insight: Good   Executive Functions  Concentration: Good  Attention Span: Good  Recall: Good  Fund of Knowledge: Good  Language: Good   Psychomotor Activity  Psychomotor Activity: Psychomotor Activity: Normal   Assets  Assets: Communication Skills; Desire for  Improvement; Physical Health; Social Support; Vocational/Educational   Sleep  Sleep: Sleep: Good  Estimated Sleeping Duration (Last 24 Hours): 7.50-9.75 hours  No data recorded  Physical Exam  Physical Exam Vitals and nursing note reviewed.  Constitutional:      Appearance: Normal appearance.  HENT:     Head: Normocephalic.     Nose: Nose normal.  Cardiovascular:     Rate and Rhythm: Normal rate.  Pulmonary:     Effort: Pulmonary effort is normal.  Musculoskeletal:        General: Normal range of motion.     Cervical back: Normal range of motion.  Neurological:     Mental Status: He is alert and oriented to person, place, and time.  Psychiatric:        Attention and Perception: Attention normal. He does not perceive auditory or visual hallucinations.        Mood and Affect: Affect normal. Mood is anxious.        Speech: Speech normal.        Behavior: Behavior normal. Behavior is cooperative.        Thought Content: Thought content normal. Thought content is not paranoid or delusional. Thought content does not include homicidal or suicidal ideation. Thought content does not include homicidal or suicidal plan.        Cognition and Memory: Cognition and memory normal.        Judgment: Judgment normal.    Review of Systems  Constitutional: Negative.   HENT: Negative.    Eyes: Negative.   Respiratory: Negative.    Cardiovascular: Negative.   Gastrointestinal: Negative.   Genitourinary: Negative.   Musculoskeletal: Negative.   Skin: Negative.   Neurological: Negative.   Endo/Heme/Allergies: Negative.   Psychiatric/Behavioral:  Positive for substance abuse. Negative for depression, hallucinations and suicidal ideas. The patient is nervous/anxious. The patient does not have insomnia.    Blood pressure 118/68, pulse 61, temperature 98.2 F (36.8 C), resp. rate 16, SpO2 100%. There is no height or weight on file to calculate BMI.  Demographic Factors:  Male, Caucasian,  and Low socioeconomic status  Loss Factors: Legal issues and Financial problems/change in  socioeconomic status  Historical Factors: Prior suicide attempts  Risk Reduction Factors:   Sense of responsibility to family, Religious beliefs about death, Employed, Positive social support, and Positive coping skills or problem solving skills  Continued Clinical Symptoms:  Previous Psychiatric Diagnoses and Treatments Medical Diagnoses and Treatments/Surgeries  Cognitive Features That Contribute To Risk:  None    Suicide Risk:  Minimal: No identifiable suicidal ideation.  Patients presenting with no risk factors but with morbid ruminations; may be classified as minimal risk based on the severity of the depressive symptoms  Plan Of Care/Follow-up recommendations:  It is recommended to the patient to continue psychiatric medications as prescribed, after discharge from the hospital.   It is recommended to the patient to follow up with your outpatient psychiatric provider and PCP.   It was discussed with the patient, the impact of alcohol, drugs, tobacco have been there overall psychiatric and medical wellbeing, and total abstinence from substance use was recommended the patient.  8 day supply of bactrim  provided and trazodone  script sent directly to preferred pharmacy at discharge. Patient agreeable to plan. Given opportunity to ask questions. Appears to feel comfortable with discharge.    In the event of worsening symptoms, the patient is instructed to call the crisis hotline, 911, return to [facility], and/or go to the nearest ED for appropriate evaluation and treatment of symptoms.   Recommended to follow-up with primary care provider for other medical issues (ongoing treatment of cellulitis to left hand with antibiotic), concerns and or health care needs.   Disposition: Patient discharged with taxi voucher to go to where his car is parked ([address] [place], [place]). He will be  resuming/attending SA-IOP with [facility] and resuming attendance of AA meetings until he is able to return to [facility] after 2 weeks of treatment in the IOP program.   Torris House, NP 01/25/2024, 9:28 AM

## 2024-01-25 NOTE — ED Notes (Signed)
 Pt presents as pleasant and cooperative. Pt expresses readiness for discharge. Pt denies si hi and avh, verbal contract for safety provided. Pt says he feels 'pretty good'. Pt reports trazodone  given last night was effective for sleep, provider notified. Medications reviewed, questions denied.

## 2024-01-25 NOTE — ED Notes (Signed)
 Pt resting quietly no acute distress. Sleeping long intervals throughout night.  Safety maintained.

## 2024-01-25 NOTE — ED Notes (Signed)
 Discharge paperwork discussed with pt including follow up care and medications. Pt expressed understanding, questions denied. Pt escorted off unit by staff, item returned including cell phone.

## 2024-01-25 NOTE — ED Notes (Signed)
 Pt resting long intervals through night in no apparent distress. Safety maintained.

## 2024-02-25 ENCOUNTER — Emergency Department (HOSPITAL_COMMUNITY)
Admission: EM | Admit: 2024-02-25 | Discharge: 2024-02-25 | Disposition: A | Discharge status: Home / Self Care | Payer: MEDICAID | Attending: Emergency Medicine | Admitting: Emergency Medicine

## 2024-02-25 ENCOUNTER — Encounter (HOSPITAL_COMMUNITY): Payer: Self-pay

## 2024-02-25 ENCOUNTER — Other Ambulatory Visit: Payer: Self-pay

## 2024-02-25 DIAGNOSIS — K0889 Other specified disorders of teeth and supporting structures: Secondary | ICD-10-CM | POA: Insufficient documentation

## 2024-02-25 DIAGNOSIS — F17210 Nicotine dependence, cigarettes, uncomplicated: Secondary | ICD-10-CM | POA: Insufficient documentation

## 2024-02-25 MED ORDER — NAPROXEN 500 MG PO TABS: 500.0000 mg | ORAL_TABLET | Freq: Two times a day (BID) | ORAL | 0 refills | Status: AC

## 2024-02-25 MED ORDER — AMOXICILLIN 500 MG PO CAPS
500.0000 mg | ORAL_CAPSULE | Freq: Once | ORAL | Status: AC
  Administered 2024-02-25: 500 mg via ORAL
  Filled 2024-02-25: qty 1

## 2024-02-25 MED ORDER — HYDROCODONE-ACETAMINOPHEN 5-325 MG PO TABS
1.0000 | ORAL_TABLET | Freq: Once | ORAL | Status: AC
  Administered 2024-02-25: 1 via ORAL
  Filled 2024-02-25: qty 1

## 2024-02-25 MED ORDER — AMOXICILLIN 500 MG PO CAPS: 500.0000 mg | ORAL_CAPSULE | Freq: Three times a day (TID) | ORAL | 0 refills | Status: AC

## 2024-02-25 NOTE — ED Provider Notes (Signed)
 " [facility]-EMERGENCY DEPT [facility] Emergency Department Provider Note MRN:  [id number]  Arrival date & time: 02/25/24     Chief Complaint   Dental Pain   History of Present Illness   Colin Davidson is a 44 y.o. year-old male with no pertinent past medical history presenting to the ED with chief complaint of dental pain.  Pain to the right upper tooth upon wakening, feeling like the right side of his face is swelling up, hurts a lot.  Denies fever, no other complaints.  Review of Systems  A thorough review of systems was obtained and all systems are negative except as noted in the HPI and PMH.   Patient's Health History    Past Medical History:  Diagnosis Date   Chest pain syndrome    Drug abuse, cocaine type (HCC)    Inguinal hernia    Palpitations    PUD (peptic ulcer disease)     Past Surgical History:  Procedure Laterality Date   CYSTOSTOMY W/ BLADDER BIOPSY      Family History  Family history unknown: Yes    Social History   Socioeconomic History   Marital status: Married    Spouse name: Not on file   Number of children: Not on file   Years of education: Not on file   Highest education level: Not on file  Occupational History   Not on file  Tobacco Use   Smoking status: Every Day    Types: Cigarettes   Smokeless tobacco: Never  Substance and Sexual Activity   Alcohol use: Yes   Drug use: Yes    Types: Cocaine, IV   Sexual activity: Yes    Partners: Female    Birth control/protection: None  Other Topics Concern   Not on file  Social History Narrative   Not on file   Social Drivers of Health   Tobacco Use: High Risk (02/25/2024)   Patient History    Smoking Tobacco Use: Every Day    Smokeless Tobacco Use: Never    Passive Exposure: Not on file  Financial Resource Strain: Not on file  Food Insecurity: Food Insecurity Present (01/22/2024)   Epic    Worried About Programme Researcher, Broadcasting/film/video in the Last Year: Often true    Barista in the Last  Year: Often true  Transportation Needs: Unmet Transportation Needs (01/22/2024)   Epic    Lack of Transportation (Medical): Yes    Lack of Transportation (Non-Medical): Yes  Physical Activity: Not on file  Stress: Stress Concern Present (12/30/2022)   Received from [facility] - Occupational Stress Questionnaire    Feeling of Stress : Very much  Social Connections: Not on file  Intimate Partner Violence: Not At Risk (01/22/2024)   Epic    Fear of Current or Ex-Partner: No    Emotionally Abused: No    Physically Abused: No    Sexually Abused: No  Depression (PHQ2-9): Low Risk (01/25/2024)   Depression (PHQ2-9)    PHQ-2 Score: 2  Alcohol Screen: Not on file  Housing: Not on file  Utilities: Patient Declined (01/22/2024)   Epic    Threatened with loss of utilities: Patient declined  Health Literacy: Not on file     Physical Exam   Vitals:   02/25/24 0123  BP: (!) 133/98  Pulse: 64  Resp: 14  Temp: 97.7 F (36.5 C)  SpO2: 100%    CONSTITUTIONAL: Well-appearing, NAD NEURO/PSYCH:  Alert and oriented x 3, no focal deficits EYES:  eyes equal and reactive ENT/NECK:  no LAD, no JVD CARDIO: Regular rate, well-perfused, normal S1 and S2 PULM:  CTAB no wheezing or rhonchi GI/GU:  non-distended, non-tender MSK/SPINE:  No gross deformities, no edema SKIN:  no rash, atraumatic   *Additional and/or pertinent findings included in MDM below  Diagnostic and Interventional Summary    EKG Interpretation Date/Time:    Ventricular Rate:    PR Interval:    QRS Duration:    QT Interval:    QTC Calculation:   R Axis:      Text Interpretation:         Labs Reviewed - No data to display  No orders to display    Medications  HYDROcodone -acetaminophen  (NORCO/VICODIN) 5-325 MG per tablet 1 tablet (has no administration in time range)  amoxicillin  (AMOXIL ) capsule 500 mg (has no administration in time range)     Procedures  /   Critical Care Procedures  ED Course and Medical Decision Making  Initial Impression and Ddx No significant facial swelling that I can see objectively.  Well-appearing in no acute distress.  May have a very tiny gingival abscess near tooth #5.  Looks like a whitish discoloration to the gingiva.  However patient says this has been present for quite a while.  No signs of more significant infection, nothing to suggest Ludwick's, no systemic symptoms.  Past medical/surgical history that increases complexity of ED encounter: None  Interpretation of Diagnostics Laboratory and/or imaging options to aid in the diagnosis/care of the patient were considered.  After careful history and physical examination, it was determined that there was no indication for diagnostics at this time.  Patient Reassessment and Ultimate Disposition/Management     Discharged with dentistry follow-up.  Return precautions.  Patient management required discussion with the following services or consulting groups:  None  Complexity of Problems Addressed Acute complicated illness or Injury  Additional Data Reviewed and Analyzed Further history obtained from: None  Additional Factors Impacting ED Encounter Risk Prescriptions  Ozell HERO. Theadore, MD [facility] Emergency Medicine [facility] [email or web address]  Final Clinical Impressions(s) / ED Diagnoses     ICD-10-CM   1. Pain, dental  K08.89       ED Discharge Orders          Ordered    naproxen  (NAPROSYN ) 500 MG tablet  2 times daily        02/25/24 0150    amoxicillin  (AMOXIL ) 500 MG capsule  3 times daily        02/25/24 0150             Discharge Instructions Discussed with and Provided to Patient:    Discharge Instructions      You were evaluated in the Emergency Department and after careful evaluation, we did not find any emergent condition requiring admission or further testing in the hospital.  Your exam/testing today is  overall reassuring.  Symptoms likely due to a tooth infection.  Take the amoxicillin  antibiotic as directed.  Use the Naprosyn  anti-inflammatory for pain.  Follow-up with a dentist.  Please return to the Emergency Department if you experience any worsening of your condition.   Thank you for allowing us  to be a part of your care.      Theadore Ozell HERO, MD 02/25/24 [phone]  "

## 2024-02-25 NOTE — ED Triage Notes (Signed)
 Right upper dental pain. Hurts bad.

## 2024-02-25 NOTE — ED Notes (Signed)
 Discharge instructions reviewed.   Newly prescribed medications discussed. Pharmacy verified.   Opportunity for questions and concerns provided.   Alert, oriented and ambulatory. Has a safe ride home.   Displays no signs of distress.

## 2024-02-25 NOTE — Discharge Instructions (Addendum)
 You were evaluated in the Emergency Department and after careful evaluation, we did not find any emergent condition requiring admission or further testing in the hospital.  Your exam/testing today is overall reassuring.  Symptoms likely due to a tooth infection.  Take the amoxicillin  antibiotic as directed.  Use the Naprosyn  anti-inflammatory for pain.  Follow-up with a dentist.  Please return to the Emergency Department if you experience any worsening of your condition.   Thank you for allowing us  to be a part of your care.

## 2024-03-04 ENCOUNTER — Other Ambulatory Visit: Payer: Self-pay

## 2024-03-04 ENCOUNTER — Encounter (HOSPITAL_COMMUNITY): Payer: Self-pay

## 2024-03-04 ENCOUNTER — Emergency Department (HOSPITAL_COMMUNITY)
Admission: EM | Admit: 2024-03-04 | Discharge: 2024-03-04 | Disposition: A | Discharge status: Home / Self Care | Payer: MEDICAID | Attending: Emergency Medicine | Admitting: Emergency Medicine

## 2024-03-04 DIAGNOSIS — R5383 Other fatigue: Secondary | ICD-10-CM | POA: Diagnosis not present

## 2024-03-04 DIAGNOSIS — K0889 Other specified disorders of teeth and supporting structures: Secondary | ICD-10-CM | POA: Insufficient documentation

## 2024-03-04 LAB — CBC WITH DIFFERENTIAL/PLATELET
Abs Immature Granulocytes: 0.01 10*3/uL (ref 0.00–0.07)
Basophils Absolute: 0 10*3/uL (ref 0.0–0.1)
Basophils Relative: 1 %
Eosinophils Absolute: 0.1 10*3/uL (ref 0.0–0.5)
Eosinophils Relative: 2 %
HCT: 46.6 % (ref 39.0–52.0)
Hemoglobin: 16 g/dL (ref 13.0–17.0)
Immature Granulocytes: 0 %
Lymphocytes Relative: 41 %
Lymphs Abs: 3.1 10*3/uL (ref 0.7–4.0)
MCH: 31.6 pg (ref 26.0–34.0)
MCHC: 34.3 g/dL (ref 30.0–36.0)
MCV: 91.9 fL (ref 80.0–100.0)
Monocytes Absolute: 0.5 10*3/uL (ref 0.1–1.0)
Monocytes Relative: 7 %
Neutro Abs: 3.8 10*3/uL (ref 1.7–7.7)
Neutrophils Relative %: 49 %
Platelets: 315 10*3/uL (ref 150–400)
RBC: 5.07 MIL/uL (ref 4.22–5.81)
RDW: 12.5 % (ref 11.5–15.5)
WBC: 7.6 10*3/uL (ref 4.0–10.5)
nRBC: 0 % (ref 0.0–0.2)

## 2024-03-04 LAB — COMPREHENSIVE METABOLIC PANEL WITH GFR
ALT: 15 U/L (ref 0–44)
AST: 30 U/L (ref 15–41)
Albumin: 4.3 g/dL (ref 3.5–5.0)
Alkaline Phosphatase: 108 U/L (ref 38–126)
Anion gap: 11 (ref 5–15)
BUN: 11 mg/dL (ref 6–20)
CO2: 27 mmol/L (ref 22–32)
Calcium: 9.4 mg/dL (ref 8.9–10.3)
Chloride: 103 mmol/L (ref 98–111)
Creatinine, Ser: 0.83 mg/dL (ref 0.61–1.24)
GFR, Estimated: >60 mL/min
Glucose, Bld: 99 mg/dL (ref 70–99)
Potassium: 4.6 mmol/L (ref 3.5–5.1)
Sodium: 140 mmol/L (ref 135–145)
Total Bilirubin: <0.2 mg/dL (ref 0.0–1.2)
Total Protein: 7.8 g/dL (ref 6.5–8.1)

## 2024-03-04 LAB — RAPID HIV SCREEN (HIV 1/2 AB+AG)
HIV 1/2 Antibodies: NONREACTIVE
HIV-1 P24 Antigen - HIV24: NONREACTIVE

## 2024-03-04 MED ORDER — SODIUM CHLORIDE 0.9 % IV BOLUS
1000.0000 mL | Freq: Once | INTRAVENOUS | Status: AC
  Administered 2024-03-04: 1000 mL via INTRAVENOUS

## 2024-03-04 MED ORDER — CHLORHEXIDINE GLUCONATE 0.12 % MT SOLN: 15.0000 mL | Freq: Two times a day (BID) | OROMUCOSAL | 0 refills | Status: AC

## 2024-03-04 NOTE — ED Provider Notes (Signed)
 " [place] EMERGENCY DEPARTMENT AT [facility] Provider Note   CSN: [id number] Arrival date & time: 03/04/24  1932     Patient presents with: Dental Pain   Colin Davidson is a 44 y.o. male.   HPI 44 year old male presents with 2 concerns.  1 is continued right upper dental/gingival pain.  This has been ongoing for over a week.  He was put on amoxicillin  and states there has been no change since being put on this.  There is a discolored area above one of his teeth that he states is about the same.  He denies any trouble breathing, speaking, swallowing.  His other concern is for fatigue for the last couple days.  He states he relapsed and injected cocaine when he found out some bad news about 3 days ago.  He is concerned because he has had extra fatigue and lightheadedness since then.  He is worried about a possible infection.  He denies fever, headache, back pain, chest pain, shortness of breath, abdominal pain or rash.  No urinary symptoms.  Prior to Admission medications  Medication Sig Start Date End Date Taking? Authorizing Provider  chlorhexidine  (PERIDEX ) 0.12 % solution Use as directed 15 mLs in the mouth or throat 2 (two) times daily. 03/04/24  Yes Freddi Hamilton, MD  naproxen  (NAPROSYN ) 500 MG tablet Take 1 tablet (500 mg total) by mouth 2 (two) times daily. 02/25/24   Theadore Ozell HERO, MD  traZODone  (DESYREL ) 50 MG tablet Take 1 tablet (50 mg total) by mouth at bedtime as needed for sleep. 01/25/24   Ramzan, Mariam, NP  albuterol  (PROVENTIL  HFA;VENTOLIN  HFA) 108 (90 Base) MCG/ACT inhaler Inhale 2 puffs into the lungs every 4 (four) hours as needed for wheezing or shortness of breath (cough, shortness of breath or wheezing.). 01/09/18 10/08/19  Mario Million, MD    Allergies: Chantix [varenicline]    Review of Systems  Constitutional:  Positive for fatigue. Negative for fever.  HENT:  Positive for dental problem.   Respiratory:  Negative for cough and shortness of  breath.   Cardiovascular:  Negative for chest pain.  Gastrointestinal:  Positive for diarrhea. Negative for abdominal pain.  Genitourinary:  Negative for dysuria.  Musculoskeletal:  Negative for back pain.  Neurological:  Positive for light-headedness. Negative for weakness and headaches.    Updated Vital Signs BP 123/85 (BP Location: Left Arm)   Pulse 86   Temp 97.9 F (36.6 C) (Oral)   Resp 18   SpO2 97%   Physical Exam Vitals and nursing note reviewed.  Constitutional:      General: He is not in acute distress.    Appearance: He is well-developed. He is not ill-appearing or diaphoretic.  HENT:     Head: Normocephalic and atraumatic.     Mouth/Throat:   Eyes:     Extraocular Movements: Extraocular movements intact.     Pupils: Pupils are equal, round, and reactive to light.  Cardiovascular:     Rate and Rhythm: Normal rate and regular rhythm.     Heart sounds: Normal heart sounds. No murmur heard. Pulmonary:     Effort: Pulmonary effort is normal.     Breath sounds: Normal breath sounds.  Abdominal:     Palpations: Abdomen is soft.     Tenderness: There is no abdominal tenderness.  Musculoskeletal:     Cervical back: No rigidity.  Skin:    General: Skin is warm and dry.  Neurological:     Mental Status:  He is alert.     Comments: CN 3-12 grossly intact. 5/5 strength in all 4 extremities. Grossly normal sensation. Normal finger to nose.      (all labs ordered are listed, but only abnormal results are displayed) Labs Reviewed  CULTURE, BLOOD (ROUTINE X 2)  CULTURE, BLOOD (ROUTINE X 2)  COMPREHENSIVE METABOLIC PANEL WITH GFR  CBC WITH DIFFERENTIAL/PLATELET  MISC [facility] TEST (SEND OUT)  RAPID HIV SCREEN (HIV 1/2 AB+AG)    EKG: EKG Interpretation Date/Time:  Saturday March 04 2024 19:53:24 EST Ventricular Rate:  76 PR Interval:  153 QRS Duration:  101 QT Interval:  382 QTC Calculation: 430 R Axis:   106  Text Interpretation: Sinus rhythm Consider  right ventricular hypertrophy no acute ST/T changes similar to Dec 2025 Confirmed by Freddi Hamilton [phone]) on 03/04/2024 8:21:19 PM  Radiology: No results found.   Procedures   Medications Ordered in the ED  sodium chloride  0.9 % bolus 1,000 mL (1,000 mLs Intravenous New Bag/Given 03/04/24 2112)                                    Medical Decision Making Amount and/or Complexity of Data Reviewed External Data Reviewed: notes. Labs: ordered.    Details: Normal WBC  Risk Prescription drug management.   Patient presents with continued gingival pain.  My suspicion is since this did not improve or get worse with nearly 1 week of antibiotics, I do not think this is infectious.  He does need to follow-up with a dentist but will put on Peridex  instead.  I do not think changing to antibiotics such as clindamycin would be helpful.  I discussed with this with him and he agrees.  Otherwise, he also is complaining some generalized fatigue.  Normal vitals.  No murmur noted.  Certainly he has an increased risk of pathology because he abuses IV drugs but there is no signs of infection at this time.  I will send HIV testing as well.  However think this can be followed up as an outpatient.  Cultures were sent but again no fevers or obvious signs of illness.  Will discharge home with return precautions.     Final diagnoses:  Fatigue, unspecified type    ED Discharge Orders          Ordered    chlorhexidine  (PERIDEX ) 0.12 % solution  2 times daily        03/04/24 2222               Freddi Hamilton, MD 03/04/24 2226  "

## 2024-03-04 NOTE — Discharge Instructions (Signed)
 You can follow-up your test in MyChart.  You will be called if there are any critical values.  Otherwise, follow-up with your primary care provider.  It is also important to follow-up with a dentist in regards to your gum pain.

## 2024-03-04 NOTE — ED Triage Notes (Signed)
 Returns for ongoing right upper dental pain. Says he's been taking abx as prescribed.   Also concern for relapsing with IVD use and wants to make sure there is no infection anywhere.

## 2024-03-07 LAB — MISC LABCORP TEST (SEND OUT)
LabCorp test name: 83935
Labcorp test code: 83935

## 2024-03-10 LAB — CULTURE, BLOOD (ROUTINE X 2)
Culture: NO GROWTH
Special Requests: ADEQUATE
